# Patient Record
Sex: Male | Born: 1985 | Race: White | Hispanic: No | Marital: Married | Smoking: Never smoker
Health system: Southern US, Community
[De-identification: ages and names within clinical notes are randomized; demographics above are authoritative.]

## PROBLEM LIST (undated history)

## (undated) DIAGNOSIS — K589 Irritable bowel syndrome without diarrhea: Secondary | ICD-10-CM

## (undated) DIAGNOSIS — R7303 Prediabetes: Secondary | ICD-10-CM

## (undated) DIAGNOSIS — R600 Localized edema: Secondary | ICD-10-CM

## (undated) DIAGNOSIS — G8929 Other chronic pain: Secondary | ICD-10-CM

## (undated) DIAGNOSIS — M549 Dorsalgia, unspecified: Secondary | ICD-10-CM

## (undated) DIAGNOSIS — M2550 Pain in unspecified joint: Secondary | ICD-10-CM

## (undated) DIAGNOSIS — R1310 Dysphagia, unspecified: Secondary | ICD-10-CM

## (undated) DIAGNOSIS — R5383 Other fatigue: Secondary | ICD-10-CM

## (undated) DIAGNOSIS — E559 Vitamin D deficiency, unspecified: Secondary | ICD-10-CM

## (undated) DIAGNOSIS — K219 Gastro-esophageal reflux disease without esophagitis: Secondary | ICD-10-CM

## (undated) DIAGNOSIS — E7800 Pure hypercholesterolemia, unspecified: Secondary | ICD-10-CM

## (undated) DIAGNOSIS — R519 Headache, unspecified: Secondary | ICD-10-CM

## (undated) DIAGNOSIS — R0602 Shortness of breath: Secondary | ICD-10-CM

## (undated) HISTORY — DX: Shortness of breath: R06.02

## (undated) HISTORY — DX: Dysphagia, unspecified: R13.10

## (undated) HISTORY — DX: Localized edema: R60.0

## (undated) HISTORY — DX: Prediabetes: R73.03

## (undated) HISTORY — DX: Irritable bowel syndrome, unspecified: K58.9

## (undated) HISTORY — DX: Other chronic pain: G89.29

## (undated) HISTORY — DX: Dorsalgia, unspecified: M54.9

## (undated) HISTORY — DX: Other fatigue: R53.83

## (undated) HISTORY — PX: ADENOIDECTOMY: SUR15

## (undated) HISTORY — DX: Gastro-esophageal reflux disease without esophagitis: K21.9

## (undated) HISTORY — DX: Pain in unspecified joint: M25.50

## (undated) HISTORY — DX: Vitamin D deficiency, unspecified: E55.9

## (undated) HISTORY — DX: Headache, unspecified: R51.9

## (undated) HISTORY — DX: Pure hypercholesterolemia, unspecified: E78.00

---
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
## 2014-06-18 ENCOUNTER — Encounter (HOSPITAL_BASED_OUTPATIENT_CLINIC_OR_DEPARTMENT_OTHER): Payer: Self-pay | Admitting: Emergency Medicine

## 2014-06-18 ENCOUNTER — Emergency Department (HOSPITAL_BASED_OUTPATIENT_CLINIC_OR_DEPARTMENT_OTHER)
Admission: EM | Admit: 2014-06-18 | Discharge: 2014-06-18 | Disposition: A | Discharge status: Home / Self Care | Payer: Managed Care, Other (non HMO) | Attending: Emergency Medicine | Admitting: Emergency Medicine

## 2014-06-18 DIAGNOSIS — M545 Low back pain, unspecified: Secondary | ICD-10-CM | POA: Insufficient documentation

## 2014-06-18 DIAGNOSIS — E860 Dehydration: Secondary | ICD-10-CM | POA: Insufficient documentation

## 2014-06-18 LAB — BASIC METABOLIC PANEL
BUN: 13 mg/dL (ref 6–23)
CHLORIDE: 100 meq/L (ref 96–112)
CO2: 27 mEq/L (ref 19–32)
CREATININE: 0.9 mg/dL (ref 0.50–1.35)
Calcium: 9.2 mg/dL (ref 8.4–10.5)
GFR calc non Af Amer: >90 mL/min (ref >90)
Glucose, Bld: 88 mg/dL (ref 70–99)
Potassium: 3.8 mEq/L (ref 3.7–5.3)
Sodium: 140 mEq/L (ref 137–147)

## 2014-06-18 LAB — URINALYSIS, ROUTINE W REFLEX MICROSCOPIC
BILIRUBIN URINE: NEGATIVE
Glucose, UA: NEGATIVE mg/dL
Hgb urine dipstick: NEGATIVE
KETONES UR: NEGATIVE mg/dL
Leukocytes, UA: NEGATIVE
NITRITE: NEGATIVE
PH: 6 (ref 5.0–8.0)
Protein, ur: NEGATIVE mg/dL
Specific Gravity, Urine: 1.011 (ref 1.005–1.030)
UROBILINOGEN UA: 1 mg/dL (ref 0.0–1.0)

## 2014-06-18 MED ORDER — SODIUM CHLORIDE 0.9 % IV BOLUS (SEPSIS)
1000.0000 mL | Freq: Once | INTRAVENOUS | Status: AC
  Administered 2014-06-18: 1000 mL via INTRAVENOUS

## 2014-06-18 NOTE — ED Provider Notes (Signed)
CSN: [id number]     Arrival date & time 06/18/14  1708 History   First MD Initiated Contact with Patient 06/18/14 1754     Chief Complaint  Patient presents with  . 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 He denies vomiting or diarrhea. He has been drinking enough fluids and Gatorade. No fever. He feels dizzy when he stands but no syncope or near syncope. He complains of lower back pain and soreness.   The history is provided by the patient. No language interpreter was used.    History reviewed. No pertinent past medical history. History reviewed. No pertinent past surgical history. No family history on file. History  Substance Use Topics  . Smoking status: Never Smoker   . Smokeless tobacco: Not on file  . Alcohol Use: No    Review of Systems  Constitutional: Negative for fever.  Respiratory: Negative.   Cardiovascular: Negative.   Gastrointestinal: Negative.   Genitourinary: Negative for decreased urine volume.  Musculoskeletal: Positive for back pain.  Neurological: Positive for dizziness and weakness.      Allergies  Review of patient's allergies indicates no known allergies.  Home Medications   Prior to Admission medications   Medication Sig Start Date End Date Taking? Authorizing Provider  cetirizine (ZYRTEC) 10 MG tablet Take 10 mg by mouth as needed for allergies.   Yes Historical Provider, MD   BP 139/95  Pulse 82  Temp(Src) 97.5 F (36.4 C) (Oral)  Resp 16  Ht 6\' 3"  (1.905 m)  Wt 238 lb (107.956 kg)  BMI 29.75 kg/m2  SpO2 99% Physical Exam  Constitutional: He is oriented to person, place, and time. He appears well-developed and well-nourished.  HENT:  Head: Normocephalic.  Neck: Normal range of motion. Neck supple.   Cardiovascular: Normal rate and regular rhythm.   Pulmonary/Chest: Effort normal and breath sounds normal.  Abdominal: Soft. Bowel sounds are normal. There is no tenderness. There is no rebound and no guarding.  Musculoskeletal: Normal range of motion.  No palpable muscular back tenderness.   Neurological: He is alert and oriented to person, place, and time.  Skin: Skin is warm and dry. No rash noted.  Psychiatric: He has a normal mood and affect.    ED Course  Procedures (including critical care time) Labs Review Labs Reviewed  URINALYSIS, ROUTINE W REFLEX MICROSCOPIC  BASIC METABOLIC PANEL    Imaging Review No results found.   EKG Interpretation None      MDM   Final diagnoses:  None    1. Mild dehydration  Chemistries and urine show no signs of significant dehydration. IV fluids given with improvement in patient's condition. He feels improved. No nausea. No further dizziness. Stable for discharge.     Arnoldo HookerShari A Upstill, PA-C 06/18/14 1921

## 2014-06-18 NOTE — ED Notes (Signed)
Pt c/o feeling dehydrated and muscle cramps. [facility] [place] [facility] [place] and [place] labs and urine tests. Pt sts he was told to drink fluids. Pt sts his back is hurting worse so he decided to come to ED.

## 2014-06-18 NOTE — Discharge Instructions (Signed)
Dehydration, Adult Dehydration is when you lose more fluids from the body than you take in. Vital organs like the kidneys, brain, and heart cannot function without a proper amount of fluids and salt. Any loss of fluids from the body can cause dehydration.  CAUSES   Vomiting.  Diarrhea.  Excessive sweating.  Excessive urine output.  Fever. SYMPTOMS  Mild dehydration  Thirst.  Dry lips.  Slightly dry mouth. Moderate dehydration  Very dry mouth.  Sunken eyes.  Skin does not bounce back quickly when lightly pinched and released.  Dark urine and decreased urine production.  Decreased tear production.  Headache. Severe dehydration  Very dry mouth.  Extreme thirst.  Rapid, weak pulse (more than 100 beats per minute at rest).  Cold hands and feet.  Not able to sweat in spite of heat and temperature.  Rapid breathing.  Blue lips.  Confusion and lethargy.  Difficulty being awakened.  Minimal urine production.  No tears. DIAGNOSIS  Your caregiver will diagnose dehydration based on your symptoms and your exam. Blood and urine tests will help confirm the diagnosis. The diagnostic evaluation should also identify the cause of dehydration. TREATMENT  Treatment of mild or moderate dehydration can often be done at home by increasing the amount of fluids that you drink. It is best to drink small amounts of fluid more often. Drinking too much at one time can make vomiting worse. Refer to the home care instructions below. Severe dehydration needs to be treated at the hospital where you will probably be given intravenous (IV) fluids that contain water and electrolytes. HOME CARE INSTRUCTIONS   Ask your caregiver about specific rehydration instructions.  Drink enough fluids to keep your urine clear or pale yellow.  Drink small amounts frequently if you have nausea and vomiting.  Eat as you normally do.  Avoid:  Foods or drinks high in sugar.  Carbonated  drinks.  Juice.  Extremely hot or cold fluids.  Drinks with caffeine.  Fatty, greasy foods.  Alcohol.  Tobacco.  Overeating.  Gelatin desserts.  Wash your hands well to avoid spreading bacteria and viruses.  Only take over-the-counter or prescription medicines for pain, discomfort, or fever as directed by your caregiver.  Ask your caregiver if you should continue all prescribed and over-the-counter medicines.  Keep all follow-up appointments with your caregiver. SEEK MEDICAL CARE IF:  You have abdominal pain and it increases or stays in one area (localizes).  You have a rash, stiff neck, or severe headache.  You are irritable, sleepy, or difficult to awaken.  You are weak, dizzy, or extremely thirsty. SEEK IMMEDIATE MEDICAL CARE IF:   You are unable to keep fluids down or you get worse despite treatment.  You have frequent episodes of vomiting or diarrhea.  You have blood or green matter (bile) in your vomit.  You have blood in your stool or your stool looks black and tarry.  You have not urinated in 6 to 8 hours, or you have only urinated a small amount of very dark urine.  You have a fever.  You faint. MAKE SURE YOU:   Understand these instructions.  Will watch your condition.  Will get help right away if you are not doing well or get worse. Document Released: 12/13/2005 Document Revised: 03/06/2012 Document Reviewed: 08/02/2011 ExitCare Patient Information 2015 [employer]. This information is not intended to replace advice given to you by your health care provider. Make sure you discuss any questions you have with your health care   provider.  

## 2014-06-18 NOTE — ED Notes (Signed)
Reports working out in the heat the past few days and not drinking enough.  Seen by PMD earlier today, dx with dehydration and instructed to increase fluids.  Cramps are worse this evening.

## 2014-06-19 ENCOUNTER — Encounter (HOSPITAL_BASED_OUTPATIENT_CLINIC_OR_DEPARTMENT_OTHER): Payer: Self-pay | Admitting: Emergency Medicine

## 2014-06-19 ENCOUNTER — Emergency Department (HOSPITAL_BASED_OUTPATIENT_CLINIC_OR_DEPARTMENT_OTHER): Payer: Managed Care, Other (non HMO)

## 2014-06-19 ENCOUNTER — Emergency Department (HOSPITAL_BASED_OUTPATIENT_CLINIC_OR_DEPARTMENT_OTHER)
Admission: EM | Admit: 2014-06-19 | Discharge: 2014-06-19 | Disposition: A | Discharge status: Home / Self Care | Payer: Managed Care, Other (non HMO) | Attending: Emergency Medicine | Admitting: Emergency Medicine

## 2014-06-19 DIAGNOSIS — R630 Anorexia: Secondary | ICD-10-CM | POA: Insufficient documentation

## 2014-06-19 DIAGNOSIS — R110 Nausea: Secondary | ICD-10-CM | POA: Insufficient documentation

## 2014-06-19 DIAGNOSIS — M545 Low back pain, unspecified: Secondary | ICD-10-CM | POA: Insufficient documentation

## 2014-06-19 DIAGNOSIS — Z792 Long term (current) use of antibiotics: Secondary | ICD-10-CM | POA: Insufficient documentation

## 2014-06-19 DIAGNOSIS — Z791 Long term (current) use of non-steroidal anti-inflammatories (NSAID): Secondary | ICD-10-CM | POA: Insufficient documentation

## 2014-06-19 DIAGNOSIS — R509 Fever, unspecified: Secondary | ICD-10-CM

## 2014-06-19 LAB — COMPREHENSIVE METABOLIC PANEL
ALBUMIN: 4 g/dL (ref 3.5–5.2)
ALT: 32 U/L (ref 0–53)
AST: 43 U/L — AB (ref 0–37)
Alkaline Phosphatase: 65 U/L (ref 39–117)
BUN: 11 mg/dL (ref 6–23)
CALCIUM: 9 mg/dL (ref 8.4–10.5)
CHLORIDE: 97 meq/L (ref 96–112)
CO2: 25 mEq/L (ref 19–32)
CREATININE: 0.9 mg/dL (ref 0.50–1.35)
GFR calc Af Amer: >90 mL/min (ref >90)
GFR calc non Af Amer: >90 mL/min (ref >90)
Glucose, Bld: 112 mg/dL — ABNORMAL HIGH (ref 70–99)
Potassium: 4.2 mEq/L (ref 3.7–5.3)
Sodium: 133 mEq/L — ABNORMAL LOW (ref 137–147)
TOTAL PROTEIN: 7.4 g/dL (ref 6.0–8.3)
Total Bilirubin: 1 mg/dL (ref 0.3–1.2)

## 2014-06-19 LAB — CBC WITH DIFFERENTIAL/PLATELET
BASOS ABS: 0 10*3/uL (ref 0.0–0.1)
Basophils Relative: 0 % (ref 0–1)
EOS ABS: 0 10*3/uL (ref 0.0–0.7)
Eosinophils Relative: 0 % (ref 0–5)
HEMATOCRIT: 43.4 % (ref 39.0–52.0)
Hemoglobin: 15.2 g/dL (ref 13.0–17.0)
LYMPHS ABS: 0.3 10*3/uL — AB (ref 0.7–4.0)
LYMPHS PCT: 6 % — AB (ref 12–46)
MCH: 30 pg (ref 26.0–34.0)
MCHC: 35 g/dL (ref 30.0–36.0)
MCV: 85.8 fL (ref 78.0–100.0)
MONO ABS: 0.4 10*3/uL (ref 0.1–1.0)
Monocytes Relative: 9 % (ref 3–12)
NEUTROS ABS: 3.8 10*3/uL (ref 1.7–7.7)
Neutrophils Relative %: 85 % — ABNORMAL HIGH (ref 43–77)
Platelets: 137 10*3/uL — ABNORMAL LOW (ref 150–400)
RBC: 5.06 MIL/uL (ref 4.22–5.81)
RDW: 12.5 % (ref 11.5–15.5)
WBC: 4.5 10*3/uL (ref 4.0–10.5)

## 2014-06-19 LAB — URINALYSIS, ROUTINE W REFLEX MICROSCOPIC
Bilirubin Urine: NEGATIVE
GLUCOSE, UA: NEGATIVE mg/dL
HGB URINE DIPSTICK: NEGATIVE
Ketones, ur: NEGATIVE mg/dL
LEUKOCYTES UA: NEGATIVE
Nitrite: NEGATIVE
PH: 6.5 (ref 5.0–8.0)
Protein, ur: NEGATIVE mg/dL
SPECIFIC GRAVITY, URINE: 1.013 (ref 1.005–1.030)
Urobilinogen, UA: 1 mg/dL (ref 0.0–1.0)

## 2014-06-19 MED ORDER — NAPROXEN 500 MG PO TABS: 500.0000 mg | ORAL_TABLET | Freq: Two times a day (BID) | ORAL | Status: DC

## 2014-06-19 MED ORDER — ONDANSETRON HCL 4 MG/2ML IJ SOLN
4.0000 mg | Freq: Once | INTRAMUSCULAR | Status: AC
  Administered 2014-06-19: 4 mg via INTRAVENOUS
  Filled 2014-06-19: qty 2

## 2014-06-19 MED ORDER — HYDROCODONE-ACETAMINOPHEN 5-325 MG PO TABS: 1.0000 | ORAL_TABLET | ORAL | Status: DC | PRN

## 2014-06-19 MED ORDER — KETOROLAC TROMETHAMINE 30 MG/ML IJ SOLN
30.0000 mg | Freq: Once | INTRAMUSCULAR | Status: AC
  Administered 2014-06-19: 30 mg via INTRAVENOUS
  Filled 2014-06-19: qty 1

## 2014-06-19 MED ORDER — DOXYCYCLINE HYCLATE 100 MG IV SOLR: 100.0000 mg | Freq: Once | INTRAVENOUS | Status: DC

## 2014-06-19 MED ORDER — DOXYCYCLINE HYCLATE 100 MG PO TABS
100.0000 mg | ORAL_TABLET | Freq: Once | ORAL | Status: AC
  Administered 2014-06-19: 100 mg via ORAL

## 2014-06-19 MED ORDER — SODIUM CHLORIDE 0.9 % IV SOLN
1000.0000 mL | Freq: Once | INTRAVENOUS | Status: AC
  Administered 2014-06-19: 1000 mL via INTRAVENOUS

## 2014-06-19 MED ORDER — ACETAMINOPHEN 325 MG PO TABS
650.0000 mg | ORAL_TABLET | Freq: Once | ORAL | Status: AC
  Administered 2014-06-19: 650 mg via ORAL
  Filled 2014-06-19: qty 2

## 2014-06-19 MED ORDER — SODIUM CHLORIDE 0.9 % IV SOLN
1000.0000 mL | INTRAVENOUS | Status: DC
  Administered 2014-06-19: 1000 mL via INTRAVENOUS

## 2014-06-19 MED ORDER — DOXYCYCLINE HYCLATE 100 MG PO CAPS: 100.0000 mg | ORAL_CAPSULE | Freq: Two times a day (BID) | ORAL | Status: DC

## 2014-06-19 NOTE — ED Notes (Signed)
C/o chills, HA, nausea, back pain, dizziness and HA. "feel dehydrated". Alert, NAD, calm, interactive, speech clear[facility]

## 2014-06-19 NOTE — Discharge Instructions (Signed)
Fever, Adult A fever is a higher than normal body temperature. In an adult, an oral temperature around 98.6 F (37 C) is considered normal. A temperature of 100.4 F (38 C) or higher is generally considered a fever. Mild or moderate fevers generally have no long-term effects and often do not require treatment. Extreme fever (greater than or equal to 106 F or 41.1 C) can cause seizures. The sweating that may occur with repeated or prolonged fever may cause dehydration. Elderly people can develop confusion during a fever. A measured temperature can vary with:  Age.  Time of day.  Method of measurement (mouth, underarm, rectal, or ear). The fever is confirmed by taking a temperature with a thermometer. Temperatures can be taken different ways. Some methods are accurate and some are not.  An oral temperature is used most commonly. Electronic thermometers are fast and accurate.  An ear temperature will only be accurate if the thermometer is positioned as recommended by the manufacturer.  A rectal temperature is accurate and done for those adults who have a condition where an oral temperature cannot be taken.  An underarm (axillary) temperature is not accurate and not recommended. Fever is a symptom, not a disease.  CAUSES   Infections commonly cause fever.  Some noninfectious causes for fever include:  Some arthritis conditions.  Some thyroid or adrenal gland conditions.  Some immune system conditions.  Some types of cancer.  A medicine reaction.  High doses of certain street drugs such as methamphetamine.  Dehydration.  Exposure to high outside or room temperatures.  Occasionally, the source of a fever cannot be determined. This is sometimes called a "fever of unknown origin" (FUO).  Some situations may lead to a temporary rise in body temperature that may go away on its own. Examples are:  Childbirth.  Surgery.  Intense exercise. HOME CARE INSTRUCTIONS   Take  appropriate medicines for fever. Follow dosing instructions carefully. If you use acetaminophen to reduce the fever, be careful to avoid taking other medicines that also contain acetaminophen. Do not take aspirin for a fever if you are younger than age 65. There is an association with Reye's syndrome. Reye's syndrome is a rare but potentially deadly disease.  If an infection is present and antibiotics have been prescribed, take them as directed. Finish them even if you start to feel better.  Rest as needed.  Maintain an adequate fluid intake. To prevent dehydration during an illness with prolonged or recurrent fever, you may need to drink extra fluid.Drink enough fluids to keep your urine clear or pale yellow.  Sponging or bathing with room temperature water may help reduce body temperature. Do not use ice water or alcohol sponge baths.  Dress comfortably, but do not over-bundle. SEEK MEDICAL CARE IF:   You are unable to keep fluids down.  You develop vomiting or diarrhea.  You are not feeling at least partly better after 3 days.  You develop new symptoms or problems. SEEK IMMEDIATE MEDICAL CARE IF:   You have shortness of breath or trouble breathing.  You develop excessive weakness.  You are dizzy or you faint.  You are extremely thirsty or you are making little or no urine.  You develop new pain that was not there before (such as in the head, neck, chest, back, or abdomen).  You have persistant vomiting and diarrhea for more than 1 to 2 days.  You develop a stiff neck or your eyes become sensitive to light.  You develop a  skin rash.  You have a fever or persistent symptoms for more than 2 to 3 days.  You have a fever and your symptoms suddenly get worse. MAKE SURE YOU:   Understand these instructions.  Will watch your condition.  Will get help right away if you are not doing well or get worse. Document Released: 06/08/2001 Document Revised: 03/06/2012 Document  Reviewed: 10/14/2011 [facility] Patient Information 2015 [place], [place]. This information is not intended to replace advice given to you by your health care provider. Make sure you discuss any questions you have with your health care provider. [place] Spotted Fever [place] Spotted Fever (RMSF) is the oldest known tick-borne disease of people in the [place]. This disease was named because it was first described among people in the [place] area who had an illness characterized by a rash with red-purple-black spots. This disease is caused by a rickettsia (Rickettsia rickettsii), a bacteria carried by the tick. The [facility] wood tick and the American dog tick, acquire and transmit the RMSF bacteria (pictures NOT actual size). When a larval, nymphal or adult tick feeds on an infected rodent or larger animal, the tick can become infected. Infected adult ticks then feed on people who may then get RMSF. The tick transmits the disease to humans during a prolonged period of feeding that lasts many hours, days or even a couple weeks. The bite is painless and frequently goes unnoticed. An infected male tick may also pass the rickettsial bacteria to her eggs that then may mature to be infected adult ticks. The rickettsia that causes RMSF can also get into a person's body through damaged skin. A tick bite is not necessary. People can get RMSF if they crush a tick and get it's blood or body fluids on their skin through a small cut or sore.  DIAGNOSIS Diagnosis is made by laboratory tests.  TREATMENT Treatment is with antibiotics (medications that kill rickettsia and other bacteria). Immediate treatment usually prevents death. GEOGRAPHIC RANGE This disease was reported only in the [place] [facility] [place] until 1931. RMSF has more recently been described among individuals in all states except [place] and [place]. The highest reported incidences of RMSF now occur among residents of [place],  [place], [place] and [address] [place]. TIME OF YEAR  Most cases are diagnosed during late spring and summer when ticks are most active. However, especially in the warmer [place], a few cases occur during the winter. SYMPTOMS   Symptoms of RMSF begin from 2 to 14 days after a tick bite. The most common early symptoms are fever, muscle aches and headache followed by nausea (feeling sick to your stomach) or vomiting.  The RMSF rash is typically delayed until 3 or more days after symptom onset, and eventually develops in 9 of 10 infected patients by the 5th day of illness. If the disease is not treated it can cause death. If you get a fever, headache, muscle aches, rash, nausea or vomiting within 2 weeks of a possible tick bite or exposure you should see your caregiver immediately. PREVENTION Ticks prefer to hide in shady, moist ground litter. They can often be found above the ground clinging to tall grass, brush, shrubs and low tree branches. They also inhabit lawns and gardens, especially at the edges of woodlands and around old stone walls. Within the areas where ticks generally live, no naturally vegetated area can be considered completely free of infected ticks. The best precaution against RMSF is to avoid contact with soil,  leaf litter and vegetation as much as possible in tick infested areas. For those who enjoy gardening or walking in their yards, clear brush and mow tall grass around houses and at the edges of gardens. This may help reduce the tick population in the immediate area. Applications of chemical insecticides by a licensed professional in the spring (late May) and Fall (September) will also control ticks, especially in heavily infested areas. Treatment will never get rid of all the ticks. Getting rid of small animal populations that host ticks will also decrease the tick population. When working in the garden, Matt[place]uning shrubs, or handling soil and vegetation, wear light-colored  protective clothing and gloves. Spot-check often to prevent ticks from reaching the skin. Ticks cannot jump or fly. They will not drop from an above-ground perch onto a passing animal. Once a tick gains access to human skin it climbs upward until it reaches a more protected area. For example, the back of the knee, groin, navel, armpit, ears or nape of the neck. It then begins the slow process of embedding itself in the skin. Campers, hikers, field workers, and others who spend time in wooded, brushy or tall grassy areas can avoid exposure to ticks by using the following precautions:  Wear light-colored clothing with a tight weave to spot ticks more easily and prevent contact with the skin.  Wear long pants tucked into socks, long-sleeved shirts tucked into pants and enclosed shoes or boots along with insect repellent.  Spray clothes with insect repellent containing either DEET or Permethrin. Only DEET can be used on exposed skin. Follow the manufacturer's directions carefully.  Wear a hat and keep long hair pulled back.  Stay on cleared, well-worn trails whenever possible.  Spot-check yourself and others often for the presence of ticks on clothes. If you find one, there are likely to be others. Check thoroughly.  Remove clothes after leaving tick-infested areas. If possible, wash them to eliminate any unseen ticks. Check yourself, your children and any pets from head to toe for the presence of ticks.  Shower and shampoo. You can greatly reduce your chances of contracting RMSF if you remove attached ticks as soon as possible. Regular checks of the body, including all body sites covered by hair (head, armpits, genitals), allow removal of the tick before rickettsial transmission. To remove an attached tick, use a forceps or tweezers to detach the intact tick without leaving mouth parts in the skin. The tick bite wound should be cleansed after tick removal. Remember the most common symptoms of RMSF  are fever, muscle aches, headache and nausea or vomiting with a later onset of rash. If you get these symptoms after a tick bite and while living in an area where RMSF is found, RMSF should be suspected. If the disease is not treated, it can cause death. See your caregiver immediately if you get these symptoms. Do this even if not aware of a tick bite. Document Released: 03/27/2001 Document Revised: 03/06/2012 Document Reviewed: 11/17/2009 [facility] Patient Information 2015 [place], [place]. This information is not intended to replace advice given to you by your health care provider. Make sure you discuss any questions you have with your health care provider.

## 2014-06-19 NOTE — ED Notes (Signed)
Attempting urine sample, pt sweaty, temp decreased. Denies HA or dizziness at this time. Back pain reported as a 3/10.

## 2014-06-19 NOTE — ED Provider Notes (Signed)
  Medical screening examination/treatment/procedure(s) were performed by non-physician practitioner and as supervising physician I was immediately available for consultation/collaboration.   EKG Interpretation None         Gerhard Munchobert Lockwood, MD 06/19/14 [phone]

## 2014-06-19 NOTE — ED Notes (Signed)
"  Feels better", temp decreased, alert, NAD, calm, interactive[facility].

## 2014-06-19 NOTE — ED Provider Notes (Signed)
CSN: [id number]     Arrival date & time 06/19/14  1835 History   First MD Initiated Contact with Patient 06/19/14 1857     This chart was scribed for Linwood DibblesJon Knapp, MD by Arlan OrganAshley Leger, ED Scribe. This patient was seen in room MH06/MH06 and the patient's care was started 6:59 PM.   Chief Complaint  Patient presents with  . Dizziness   HPI  HPI Comments: Corey Pittman is a 28 y.o. male who presents to the Emergency Department complaining of constant, moderate HA  x 1 day that is unchanged. He also reports associated dizziness, loss of appetite, fever, nausea, and lower back pain onset this morning. Pt was seen 6/23 in ED and diagnosed with dehydration. Pt was given fluids at last visit with improvement. He has tried OTC Excedrin for symptoms without any noticeable improvement. No chills or noticeable rashes. He denies any known tick bites. No extended periods of time outdoors. He has no pertinent past medical history. No other concerns this visit.  History reviewed. No pertinent past medical history. History reviewed. No pertinent past surgical history. History reviewed. No pertinent family history. History  Substance Use Topics  . Smoking status: Never Smoker   . Smokeless tobacco: Not on file  . Alcohol Use: No    Review of Systems  Constitutional: Positive for fever and appetite change.  Gastrointestinal: Positive for nausea.  Musculoskeletal: Positive for back pain.  Neurological: Positive for dizziness.  All other systems reviewed and are negative.     Allergies  Review of patient's allergies indicates no known allergies.  Home Medications   Prior to Admission medications   Medication Sig Start Date End Date Taking? Authorizing Vernadine Coombs  cetirizine (ZYRTEC) 10 MG tablet Take 10 mg by mouth as needed for allergies.    Historical Damya Comley, MD  doxycycline (VIBRAMYCIN) 100 MG capsule Take 1 capsule (100 mg total) by mouth 2 (two) times daily. 06/19/14   Linwood DibblesJon Knapp, MD   HYDROcodone-acetaminophen (NORCO/VICODIN) 5-325 MG per tablet Take 1-2 tablets by mouth every 4 (four) hours as needed. 06/19/14   Linwood DibblesJon Knapp, MD  naproxen (NAPROSYN) 500 MG tablet Take 1 tablet (500 mg total) by mouth 2 (two) times daily. 06/19/14   Linwood DibblesJon Knapp, MD   Triage Vitals: BP 129/73  Pulse 108  Temp(Src) 100.6 F (38.1 C) (Oral)  Resp 16  Wt 238 lb (107.956 kg)  SpO2 100%   Physical Exam  Nursing note and vitals reviewed. Constitutional: He appears well-developed and well-nourished. No distress.  HENT:  Head: Normocephalic and atraumatic.  Right Ear: External ear normal.  Left Ear: External ear normal.  Eyes: Conjunctivae are normal. Right eye exhibits no discharge. Left eye exhibits no discharge. No scleral icterus.  Neck: Normal range of motion. Neck supple. No rigidity. No tracheal deviation, no erythema and normal range of motion present. No Brudzinski's sign and no Kernig's sign noted.  Cardiovascular: Normal rate, regular rhythm and intact distal pulses.   Pulmonary/Chest: Effort normal and breath sounds normal. No stridor. No respiratory distress. He has no wheezes. He has no rales.  Abdominal: Soft. Bowel sounds are normal. He exhibits no distension. There is no tenderness. There is no rebound and no guarding.  Musculoskeletal: He exhibits no edema and no tenderness.  Lymphadenopathy:    He has no cervical adenopathy.  Neurological: He is alert. He has normal strength. No cranial nerve deficit (no facial droop, extraocular movements intact, no slurred speech) or sensory deficit. He exhibits normal muscle tone.  He displays no seizure activity. Coordination normal.  Skin: Skin is warm and dry. No rash noted.  Psychiatric: He has a normal mood and affect.    ED Course  Procedures (including critical care time)  DIAGNOSTIC STUDIES: Oxygen Saturation is 100% on RA, Normal by my interpretation.    COORDINATION OF CARE: 10:59 PM-Discussed treatment plan with pt at  bedside and pt agreed to plan.     Labs Review Labs Reviewed  CBC WITH DIFFERENTIAL - Abnormal; Notable for the following:    Platelets 137 (*)    Neutrophils Relative % 85 (*)    Lymphocytes Relative 6 (*)    Lymphs Abs 0.3 (*)    All other components within normal limits  COMPREHENSIVE METABOLIC PANEL - Abnormal; Notable for the following:    Sodium 133 (*)    Glucose, Bld 112 (*)    AST 43 (*)    All other components within normal limits  URINALYSIS, ROUTINE W REFLEX MICROSCOPIC  ROCKY MTN SPOTTED FVR AB, IGG-BLOOD  ROCKY MTN SPOTTED FVR AB, IGM-BLOOD    Imaging Review Dg Chest 2 View  06/19/2014   CLINICAL DATA:  Fever.  Sweating.  Dizziness.  EXAM: CHEST  2 VIEW  COMPARISON:  None.  FINDINGS: Heart size and pulmonary vascularity are normal and the lungs are clear. No osseous abnormality.  IMPRESSION: Normal chest.   Electronically Signed   By: Geanie CooleyJim  Maxwell M.D.   On: 06/19/2014 20:55     MDM   Final diagnoses:  Fever, unspecified fever cause   Patient has no neck stiffness or meningismus. I doubt meningitis. There is no evidence of pneumonia. Patient does not know of any tick exposure however his symptoms could be related to early [facility] spotted fever. He does have a slight decrease in his platelet count and his sodium level.  I will start him on doxycycline.  Discussed treatment plan with the patient and his family. Recommend close outpatient followup with his primary Dr.  I personally performed the services described in this documentation, which was scribed in my presence. The recorded information has been reviewed and is accurate.    Linwood DibblesJon Knapp, MD 06/19/14 [phone]

## 2014-06-19 NOTE — ED Notes (Signed)
Pt c/o h/a and dizziness x 1 day , sen here yesterday for  same DX dehydration

## 2014-06-20 LAB — ROCKY MTN SPOTTED FVR AB, IGG-BLOOD: RMSF IGG: 0.11 IV

## 2014-06-20 LAB — ROCKY MTN SPOTTED FVR AB, IGM-BLOOD: RMSF IGM: 0.3 IV (ref 0.00–0.89)

## 2014-11-05 IMAGING — CR DG CHEST 2V
2 series · 2 of 2 positions shown · non-contrast
Comparison: None.

CLINICAL DATA: Fever.  Sweating.  Dizziness.

EXAM:
CHEST  2 VIEW

[w chest pa]
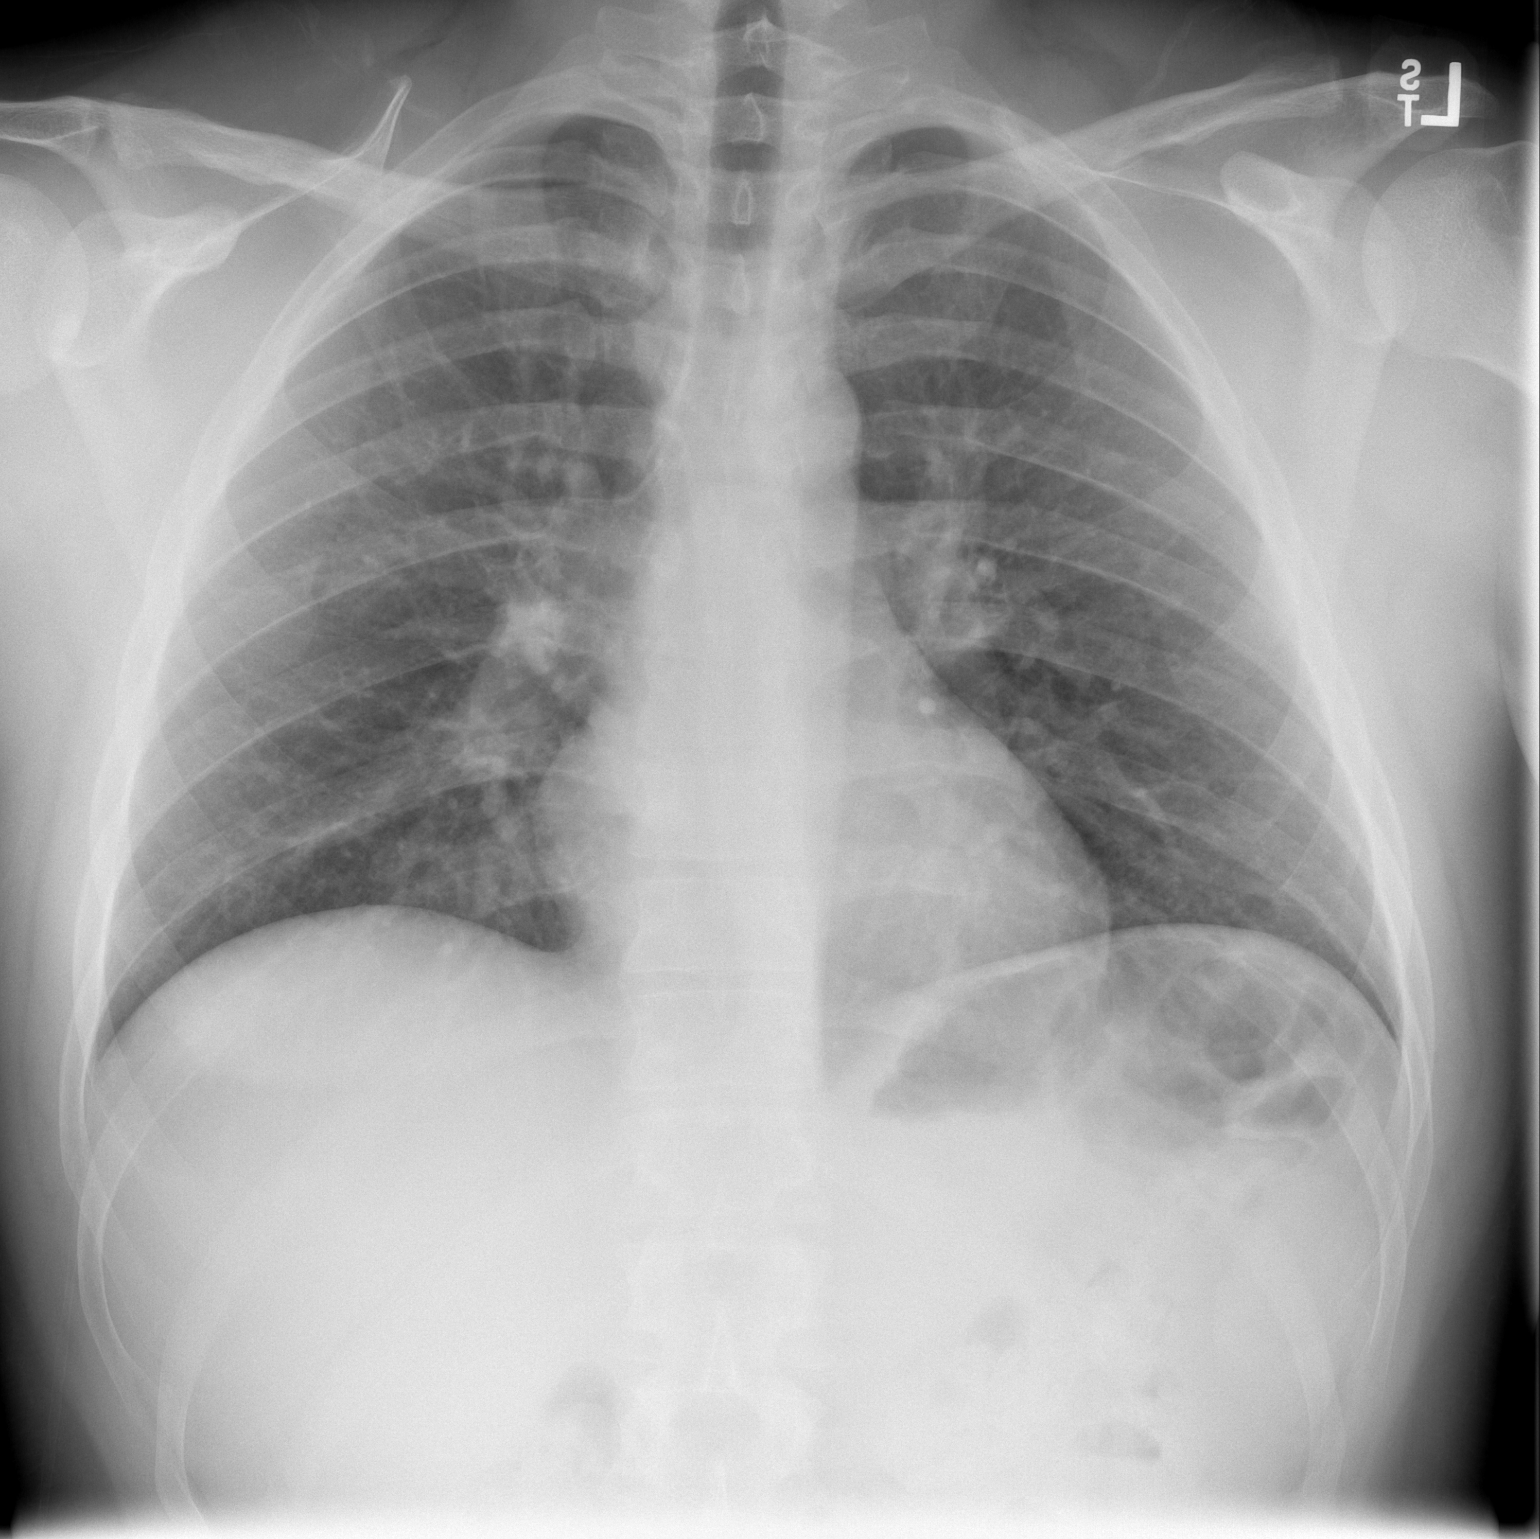

[w chest lat]
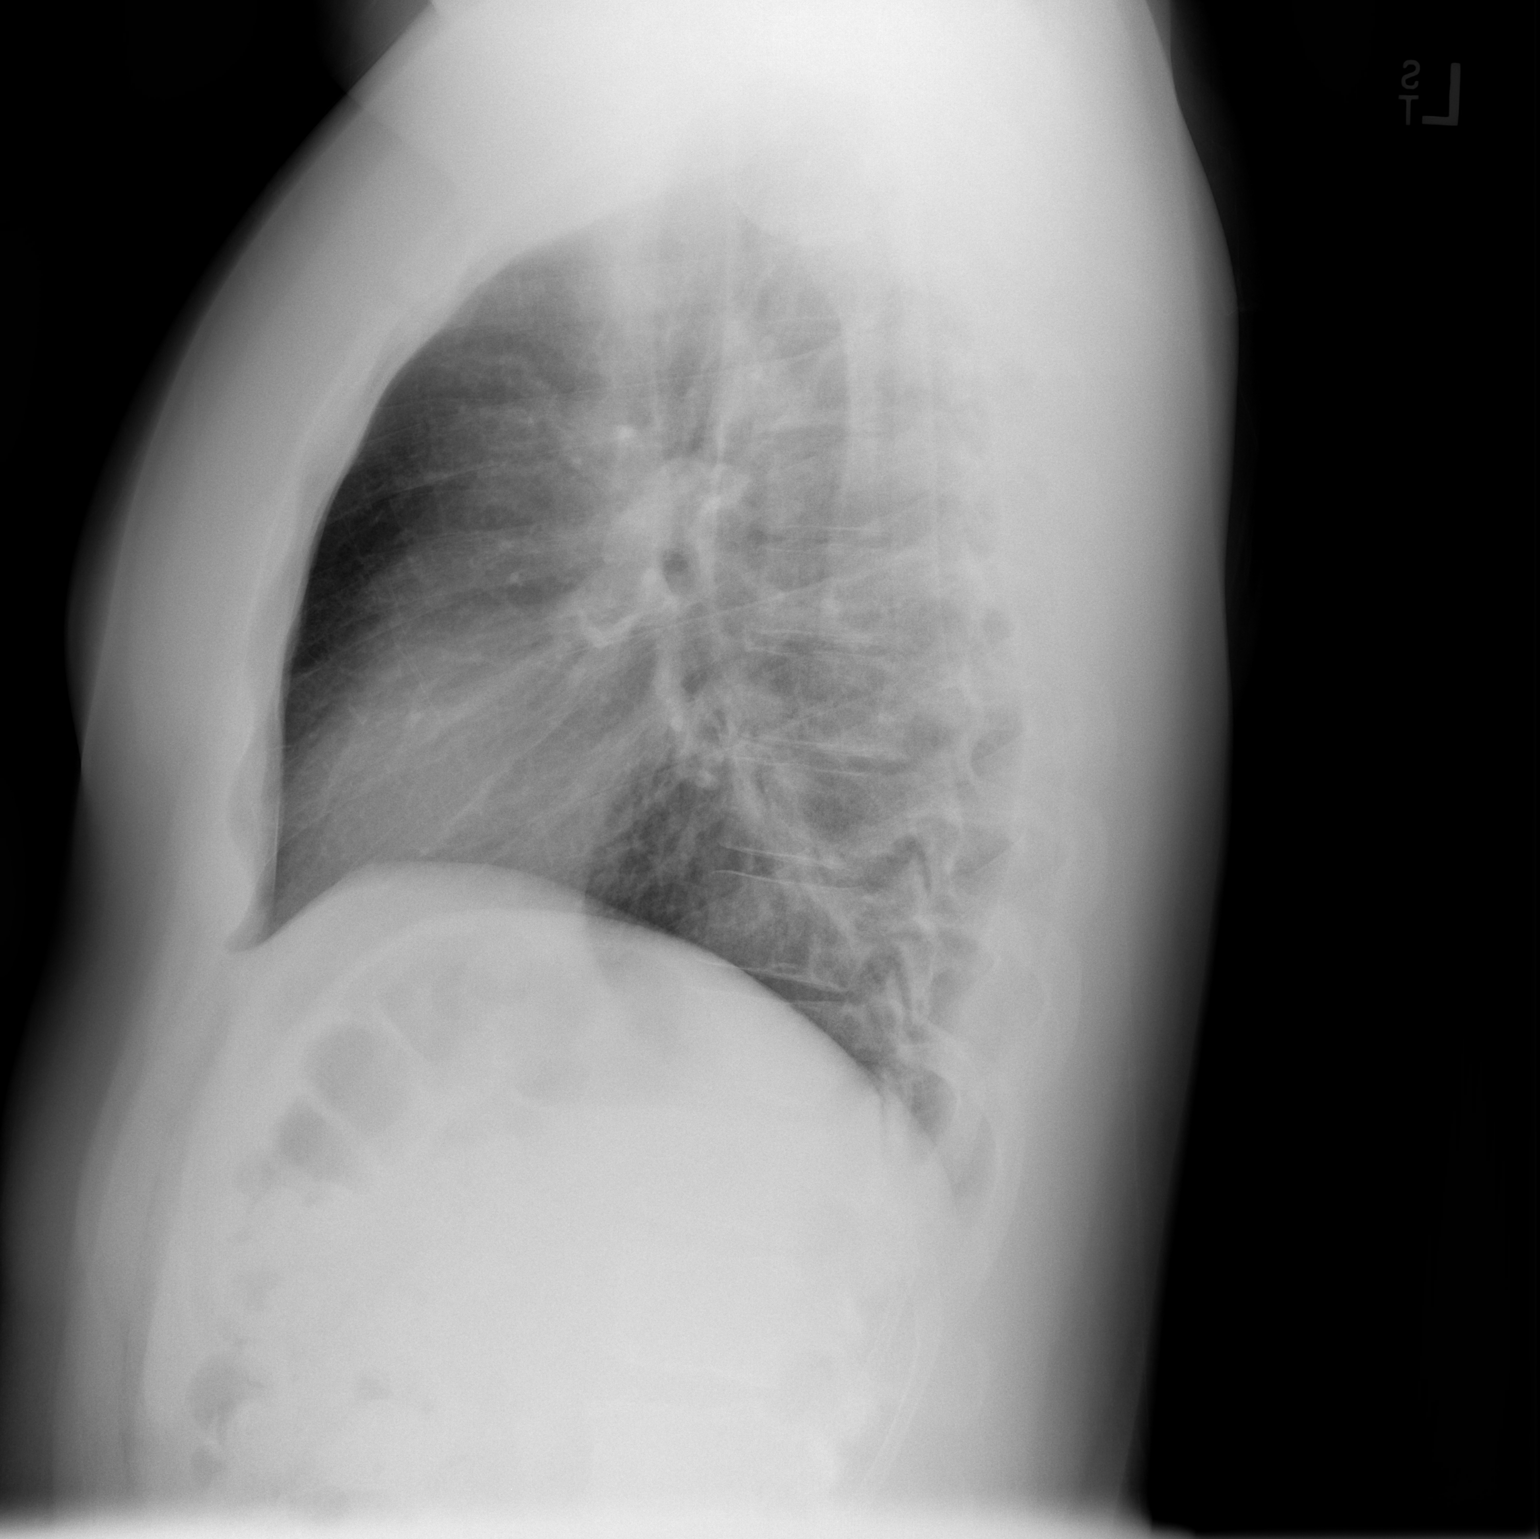

[2 of 2 positions shown; findings below may reference images not displayed]

FINDINGS: Heart size and pulmonary vascularity are normal and the lungs are
clear. No osseous abnormality.
IMPRESSION: Normal chest.

## 2016-08-31 ENCOUNTER — Emergency Department (HOSPITAL_BASED_OUTPATIENT_CLINIC_OR_DEPARTMENT_OTHER)
Admission: EM | Admit: 2016-08-31 | Discharge: 2016-08-31 | Disposition: A | Discharge status: Home / Self Care | Payer: Managed Care, Other (non HMO) | Attending: Emergency Medicine | Admitting: Emergency Medicine

## 2016-08-31 ENCOUNTER — Encounter (HOSPITAL_BASED_OUTPATIENT_CLINIC_OR_DEPARTMENT_OTHER): Payer: Self-pay | Admitting: Emergency Medicine

## 2016-08-31 DIAGNOSIS — I951 Orthostatic hypotension: Secondary | ICD-10-CM | POA: Diagnosis not present

## 2016-08-31 DIAGNOSIS — M791 Myalgia, unspecified site: Secondary | ICD-10-CM

## 2016-08-31 DIAGNOSIS — Z79899 Other long term (current) drug therapy: Secondary | ICD-10-CM | POA: Diagnosis not present

## 2016-08-31 DIAGNOSIS — R000 Tachycardia, unspecified: Secondary | ICD-10-CM | POA: Diagnosis not present

## 2016-08-31 DIAGNOSIS — R42 Dizziness and giddiness: Secondary | ICD-10-CM | POA: Diagnosis present

## 2016-08-31 LAB — BASIC METABOLIC PANEL
Anion gap: 10 (ref 5–15)
BUN: 12 mg/dL (ref 6–20)
CO2: 24 mmol/L (ref 22–32)
Calcium: 8.8 mg/dL — ABNORMAL LOW (ref 8.9–10.3)
Chloride: 100 mmol/L — ABNORMAL LOW (ref 101–111)
Creatinine, Ser: 0.77 mg/dL (ref 0.61–1.24)
GFR calc Af Amer: >60 mL/min (ref >60)
GLUCOSE: 95 mg/dL (ref 65–99)
POTASSIUM: 3.5 mmol/L (ref 3.5–5.1)
SODIUM: 134 mmol/L — AB (ref 135–145)

## 2016-08-31 LAB — CK: Total CK: 136 U/L (ref 49–397)

## 2016-08-31 LAB — TROPONIN I

## 2016-08-31 MED ORDER — SODIUM CHLORIDE 0.9 % IV BOLUS (SEPSIS)
2000.0000 mL | Freq: Once | INTRAVENOUS | Status: AC
  Administered 2016-08-31: 2000 mL via INTRAVENOUS

## 2016-08-31 MED ORDER — POTASSIUM CHLORIDE CRYS ER 20 MEQ PO TBCR: 60.0000 meq | EXTENDED_RELEASE_TABLET | Freq: Once | ORAL | Status: DC

## 2016-08-31 NOTE — ED Provider Notes (Signed)
[facility] Note   CSN: [id number] Arrival date & time: 08/31/16  1908  By signing my name below, I, Modena JanskyAlbert Thayil, attest that this documentation has been prepared under the direction and in the presence of Derwood KaplanAnkit Azhia Siefken, MD . Electronically Signed: Modena JanskyAlbert Thayil, Scribe. 08/31/2016. 10:05 PM.  History   Chief Complaint Chief Complaint  Patient presents with  . Tachycardia   The history is provided by the patient. No language interpreter was used.   HPI Comments: Corey Pittman is a 30 y.o. male who presents to the Emergency Department complaining of constant moderate dizziness that started 4 hours ago. [place]. Yesterday he started feeling faint, tachycardic, and had cramping lower back pain. He describes the dizziness as an unsteady sensation. He describes associated chest pain as sharp and non-radiating with no modifying factors. Other associated symptoms include SOB and bilateral knee cramping that started today without aggravating factors. Denies lightheadedness, hx of chest pain, cardiac problems, DVT, recent surgery, recent travel, or any recreational drug use.   History reviewed. No pertinent past medical history.  There are no active problems to display for this patient.   Past Surgical History:  Procedure Laterality Date  . ADENOIDECTOMY         Home Medications    Prior to Admission medications   Medication Sig Start Date End Date Taking? Authorizing Provider  cetirizine (ZYRTEC) 10 MG tablet Take 10 mg by mouth as needed for allergies.   Yes Historical Provider, MD  doxycycline (VIBRAMYCIN) 100 MG capsule Take 1 capsule (100 mg total) by mouth 2 (two) times daily. 06/19/14   Linwood DibblesJon Knapp, MD  HYDROcodone-acetaminophen (NORCO/VICODIN) 5-325 MG per tablet Take 1-2 tablets by mouth every 4 (four) hours as needed. 06/19/14   Linwood DibblesJon Knapp, MD  naproxen (NAPROSYN) 500 MG tablet Take 1 tablet (500 mg total) by mouth  2 (two) times daily. 06/19/14   Linwood DibblesJon Knapp, MD    Family History No family history on file.  Social History Social History  Substance Use Topics  . Smoking status: Never Smoker  . Smokeless tobacco: Never Used  . Alcohol use No     Allergies   Review of patient's allergies indicates no known allergies.   Review of Systems Review of Systems 10 Systems reviewed and all are negative for acute change except as noted in the HPI.  Physical Exam Updated Vital Signs BP 121/80 (BP Location: Right Arm)   Pulse 94   Temp 98.6 F (37 C) (Oral)   Resp 18   Ht 6\' 3"  (1.905 m)   Wt 260 lb (117.9 kg)   SpO2 98%   BMI 32.50 kg/m   Physical Exam  Constitutional: He appears well-developed and well-nourished. No distress.  HENT:  Head: Normocephalic and atraumatic.  Right Ear: External ear normal.  Left Ear: External ear normal.  Mouth/Throat: Mucous membranes are normal.  Eyes: Conjunctivae are normal. Right eye exhibits no discharge. Left eye exhibits no discharge. No scleral icterus.  Neck: Neck supple. No tracheal deviation present.  Cardiovascular: Normal rate and regular rhythm.   No murmur heard. Pulmonary/Chest: Effort normal. No stridor. No respiratory distress. He has no wheezes. He has no rales.  Musculoskeletal: He exhibits no edema.  No pitting edema.   Neurological: He is alert. Cranial nerve deficit: no gross deficits.  Skin: Skin is warm and dry. Capillary refill takes less than 2 seconds. No rash noted.  Psychiatric: He  has a normal mood and affect.  Nursing note and vitals reviewed.    ED Treatments / Results  DIAGNOSTIC STUDIES: Oxygen Saturation is 98% on RA, normal by my interpretation.    COORDINATION OF CARE: 10:09 PM- Pt advised of plan for treatment and pt agrees.  Labs (all labs ordered are listed, but only abnormal results are displayed) Labs Reviewed  BASIC METABOLIC PANEL - Abnormal; Notable for the following:       Result Value   Sodium 134  (*)    Chloride 100 (*)    Calcium 8.8 (*)    All other components within normal limits  TROPONIN I  CK    EKG  EKG Interpretation  Date/Time:  Tuesday August 31 2016 19:18:40 EDT Ventricular Rate:  107 PR Interval:  154 QRS Duration: 88 QT Interval:  324 QTC Calculation: 432 R Axis:   72 Text Interpretation:  Sinus tachycardia Otherwise normal ECG No acute changes Nonspecific T wave abnormality in lead III No old tracing to compare Confirmed by Rhunette Croft, MD, Janey Genta [id number]) on 08/31/2016 10:17:32 PM       Radiology No results found.  Procedures Procedures (including critical care time)  Medications Ordered in ED Medications  potassium chloride SA (K-DUR,KLOR-CON) CR tablet 60 mEq (not administered)  sodium chloride 0.9 % bolus 2,000 mL (0 mLs Intravenous Stopped 08/31/16 2342)     Initial Impression / Assessment and Plan / ED Course  I have reviewed the triage vital signs and the nursing notes.  Pertinent labs & imaging results that were available during my care of the patient were reviewed by me and considered in my medical decision making (see chart for details).  Clinical Course  Comment By Time  Results discussed. PE return precautions discussed and typed, patient is agreeing with the plan and is comfortable with the workup done and the recommendations from the ER.  Derwood Kaplan, MD 09/05 2347    I personally performed the services described in this documentation, which was scribed in my presence. The recorded information has been reviewed and is accurate.  Pt comes in with cc of dizziness, chest pain, myalgias, lower back pain. Healthy man. Pt has no hx of PE, DVT and denies any exogenous hormone use, long distance travels or surgery in the past 6 weeks, active cancer, recent immobilization. No smoking, drug use hx. No premature CAD in the family. Concerns for orthostatics - we will check them and hydrate. Also will ensure there was no rhabdo,    Final  Clinical Impressions(s) / ED Diagnoses   Final diagnoses:  Orthostatic hypotension  Myalgia    New Prescriptions New Prescriptions   No medications on file       Derwood Kaplan, MD 08/31/16 2348

## 2016-08-31 NOTE — ED Triage Notes (Addendum)
Pt reports elevated HR and muscle cramps in back and legs since about 3 pm. Pt also reports nausea x 2 days. Denies vomiting or diarrhea.

## 2016-08-31 NOTE — Discharge Instructions (Signed)
We saw you in the ER for the dizziness, chest pain, muscle aches. All the results in the ER are normal. Hydrate well, as there is evidence of dehydration.  Currently, we dont think there is PE. We recommend that if you notice any worsening of chest pain, worsening of shortness of breath, worsening of dizziness, with near fainting -return to the ER.

## 2016-12-09 DIAGNOSIS — E6609 Other obesity due to excess calories: Secondary | ICD-10-CM | POA: Insufficient documentation

## 2016-12-09 DIAGNOSIS — J302 Other seasonal allergic rhinitis: Secondary | ICD-10-CM | POA: Insufficient documentation

## 2017-12-22 DIAGNOSIS — R1319 Other dysphagia: Secondary | ICD-10-CM | POA: Insufficient documentation

## 2017-12-22 DIAGNOSIS — E782 Mixed hyperlipidemia: Secondary | ICD-10-CM | POA: Insufficient documentation

## 2017-12-22 DIAGNOSIS — K2100 Gastro-esophageal reflux disease with esophagitis, without bleeding: Secondary | ICD-10-CM | POA: Insufficient documentation

## 2021-01-16 DIAGNOSIS — L309 Dermatitis, unspecified: Secondary | ICD-10-CM | POA: Insufficient documentation

## 2021-11-15 NOTE — Progress Notes (Signed)
NEW PATIENT Date of Service/Encounter:  11/16/21 Referring provider: Molinda Bailiff, PA Primary care provider: Patria Mane, MD  Subjective:  Corey Pittman is a 35 y.o. male presenting today for evaluation of chronic rhinitis and chronic cough History obtained from: chart review and patient   Chronic rhinitis: started in childhood Symptoms include:  sinus pressure on face (eyes, cheeks), rhinorrhea, post nasal drainage, and sneezing , watery/itchy eyes Occurs seasonally-Spring and Summer Potential triggers: grass, dust Treatments tried: Claritin as needed Previous allergy testing: no History of reflux/heartburn:  yes-omeprazole 40 mg daily, doesn't always work  Chronic cough: Adequate positive and negative controls.  When the appointment was made, he was having chronic cough with whistling.  Not productive.  He was having a hard time breathing, and this lasted for about a month and a half.  It was worse in the morning.  He was sick prior to this.  He would get coughing fits during this and used his wife's albuterol which seemed to help. He does get winded sometimes, but isn't sure if this is due to conditioning. Never been diagnosed with asthma  Dyshidrotic eczema: occurs seasonally, severely itchy. Has steroid cream that knocks it out pretty fast.  He will get small blisters on sides of fingers.  Additionally he manages chronic issues with IBS.  Curious about allergy testing.  We discussed the limitations of using IgE testing for possible food sensitivities.  However, offered to do food allergy panel if interested to use as a guide for elimination diet.  Some patients find this helpful, but he is aware that the test was not designed for this.  He denies symptoms concerning for IgE mediated food allergy.  Other allergy screening: Food allergy:  apples make his mouth itch really bad, cooked apples tolerated without symptoms, peeling helps but does not completely eliminate symptoms.  Has  had mild lip swelling on 1 occasion following raw apple consumption.  He has similar symptoms with grapes. Medication allergy: no Hymenoptera allergy: no Urticaria: no  Past Medical History: History reviewed. No pertinent past medical history. Medication List:  Current Outpatient Medications  Medication Sig Dispense Refill   albuterol (VENTOLIN HFA) 108 (90 Base) MCG/ACT inhaler Inhale 2 puffs into the lungs every 6 (six) hours as needed for wheezing or shortness of breath. 8 g 2   azelastine (ASTELIN) 0.1 % nasal spray Place 2 sprays into both nostrils 2 (two) times daily as needed for rhinitis. Use in each nostril as directed 30 mL 6   cetirizine (ZYRTEC) 10 MG tablet Take 10 mg by mouth as needed for allergies.     fluticasone (FLONASE) 50 MCG/ACT nasal spray SPRAY 1 SPRAY INTO EACH NOSTRIL EVERY DAY     Multiple Vitamin (MULTI-VITAMIN) tablet Take 1 tablet by mouth daily.     Olopatadine HCl (PATADAY) 0.2 % SOLN Place 1 drop into both eyes daily as needed. 2.5 mL 5   omeprazole (PRILOSEC) 40 MG capsule Take by mouth.     Spacer/Aero-Holding Chambers DEVI 1 Device by Does not apply route as needed. 1 each 1   No current facility-administered medications for this visit.   Known Allergies:  No Known Allergies Past Surgical History: Past Surgical History:  Procedure Laterality Date   ADENOIDECTOMY     Family History: Family History  Problem Relation Age of Onset   Allergic rhinitis Mother    Allergic rhinitis Father    Asthma Sister    Allergic rhinitis Sister    Eczema Neg Hx  Immunodeficiency Neg Hx    Urticaria Neg Hx    Atopy Neg Hx    Social History: Corey Pittman lives in a house built 1 year ago, no water damage, carpet in the bedroom, gas heating, central AC, pet dog and doors, no visible pest, not using dust mite protection on bedding, no smoke exposure.  He works as a Medical sales representative for 10 years.  No HEPA filter in the home.  [place] an interstate/industrial area.    ROS:  All other systems negative except as noted per HPI.  Objective:  Blood pressure 116/76, pulse 66, temperature 97.7 F (36.5 C), temperature source Temporal, resp. rate 16, height 6' 3.4" (1.915 m), weight 277 lb 6.4 oz (125.8 kg), SpO2 99 %. Body mass index is 34.31 kg/m. Physical Exam:  General Appearance:  Alert, cooperative, no distress, appears stated age  Head:  Normocephalic, without obvious abnormality, atraumatic  Eyes:  Conjunctiva clear, EOM's intact  Nose: Nares normal, hypertrophic turbinates bilaterally, no visible anterior polyps,  Throat: Lips, tongue normal; teeth and gums normal, normal posterior oropharynx  Neck: Supple, symmetrical  Lungs:   Clear to auscultation bilaterally, respirations unlabored, no coughing  Heart:  Regular rate and rhythm, no murmur, appears well perfused  Extremities: No edema  Skin: Skin color, texture, turgor normal, no rashes or lesions on visualized portions of skin  Neurologic: No gross deficits   Diagnostics: Spirometry:  Tracings reviewed. His effort: Good reproducible efforts. FVC: 4.93L FEV1: 4.44L, 86% predicted FEV1/FVC ratio: 111% Interpretation: Spirometry consistent with normal pattern.  Please see scanned spirometry results for details.  Skin Testing: Environmental allergy panel. Adequate positive and negative controls. Results discussed with patient/family.  Airborne Adult Perc - 11/16/21 1115     Time Antigen Placed 1115    Allergen Manufacturer Waynette Buttery    Location Back    Number of Test 59    1. Control-Buffer 50% Glycerol Negative    2. Control-Histamine 1 mg/ml 3+    3. Albumin saline Negative    4. Bahia 3+    5. [place] 3+    6. Johnson 3+    7. Kentucky Blue 4+    8. Meadow Fescue 3+    9. Perennial Rye 3+    10. Sweet Vernal 3+    11. Timothy 4+    12. Cocklebur 3+    13. Burweed Marshelder Negative    14. Ragweed, short 4+    15. Ragweed, Giant 3+    16. Plantain,  English 3+    17.  Lamb's Quarters 2+    18. Sheep Sorrell 2+    19. Rough Pigweed 2+    20. Marsh Elder, Rough 3+    21. Mugwort, Common 3+    22. Ash mix 3+    23. Birch mix 4+    24. Beech American 3+    25. Box, Elder 3+    26. Cedar, red 3+    27. Cottonwood, Eastern 3+    28. Elm mix 2+    29. Hickory 2+    30. Maple mix Negative    31. Oak, [place] mix 4+    32. Pecan Pollen 3+    33. Pine mix Negative    34. Sycamore Eastern 3+    35. Walnut, Black Pollen 3+    36. Alternaria alternata 3+    37. Cladosporium Herbarum Negative    38. Aspergillus mix Negative    39. Penicillium mix Negative    40. Bipolaris  sorokiniana (Helminthosporium) Negative    41. Drechslera spicifera (Curvularia) Negative    42. Mucor plumbeus Negative    43. Fusarium moniliforme Negative    44. Aureobasidium pullulans (pullulara) Negative    45. Rhizopus oryzae Negative    46. Botrytis cinera Negative    47. Epicoccum nigrum Negative    48. Phoma betae Negative    49. Candida Albicans Negative    50. Trichophyton mentagrophytes Negative    51. Mite, D Farinae  5,000 AU/ml 4+    52. Mite, D Pteronyssinus  5,000 AU/ml 4+    53. Cat Hair 10,000 BAU/ml 4+    54.  Dog Epithelia Negative    55. Mixed Feathers Negative    56. Horse Epithelia Negative    57. Cockroach, German Negative    58. Mouse Negative    59. Tobacco Leaf Negative             Allergy testing results were read and interpreted by myself, documented by clinical staff.  Assessment and Plan   Patient Instructions  Chronic Rhinitis seasonal and perennial allergic rhinitis: - allergy testing today was positive to weeds, trees, grasses, outdoor mold, dust mite, cat - allergen avoidance as below - consider allergy shots as long term control of your symptoms by teaching your immune system to be more tolerant of your allergy triggers - Increase Nasal Steroid Spray: Options include Flonase (fluticasone), Nasocort (triamcinolone), Nasonex  (mometasome) 1- 2 sprays in each nostril daily (can buy over-the-counter if not covered by insurance)  Best results if used daily. - start Astelin (azelastine) use 1-2 sprays in each nostril up to two times daily as needed for NASAL CONGESTION/ITCHY NOSE. - Continue over the counter antihistamine daily or daily as needed.  Can increase to twice daily on "bad days"  -Your options include Zyrtec (Cetirizine) 10mg , Claritin (Loratadine) 10mg , Allegra (Fexofenadine) 180mg , or Xyzal (Levocetirinze) 5mg  - start Singulair (montelukast) 10 mg daily.   Allergic Conjunctivitis:  - Start Allergy Eye drops: great options include Pataday (Olopatadine) or Zaditor (ketotifen) for eye symptoms daily as needed-both sold over the counter if not covered by insurance.   -Avoid eye drops that say red eye relief as they may contain medications that dry out your eyes.  Chronic cough:  -Your breathing testing today looked great. - Has responded to albuterol in the past.  Currently asymptomatic. -Trial of albuterol (Ventolin/ProAir) to be used 2 puffs every 4-6 hours as needed for coughing, shortness of breath, or wheezing. -Make note if this is helpful.  If needing more than twice a week or having nighttime symptoms more than twice per month, please schedule follow-up appointment.  Oral Allergy Syndrome (apples, grapes): - Your symptoms are not consistent with true food allergies, and are more likely to be due to oral allergy syndrome. - These symptoms are not life-threatening and are because of a cross reaction between a pollen you are allergic to, and to a protein in specific foods (such as fresh fruits, vegetables, and nuts). - If you can eat these things it is fine to continue to do so.  If not, you may avoid these fresh fruits.  Heating these foods should allow them to be consumed without symptoms.  Chronic abdominal symptoms:  - make a follow-up at your convenience and we can do the food panel to help with food  elimination diet  Dyshidrotic eczema Symptoms currently controlled with as needed topical steroid.  He does not need refills. No current flares. Discussed use  of topical steroid twice a day as needed for flares.  Follow-up in 3 months, sooner if needed.   This note in its entirety was forwarded to the Provider who requested this consultation.  Thank you for your kind referral. I appreciate the opportunity to take part in Corey Pittman's care. Please do not hesitate to contact me with questions.  Sincerely,  Tonny Bollman, MD [facility]

## 2021-11-16 ENCOUNTER — Encounter: Payer: Self-pay | Admitting: Internal Medicine

## 2021-11-16 ENCOUNTER — Ambulatory Visit (INDEPENDENT_AMBULATORY_CARE_PROVIDER_SITE_OTHER): Payer: 59 | Admitting: Internal Medicine

## 2021-11-16 ENCOUNTER — Other Ambulatory Visit: Payer: Self-pay

## 2021-11-16 VITALS — BP 116/76 | HR 66 | Temp 97.7°F | Resp 16 | Ht 75.4 in | Wt 277.4 lb

## 2021-11-16 DIAGNOSIS — J302 Other seasonal allergic rhinitis: Secondary | ICD-10-CM | POA: Diagnosis not present

## 2021-11-16 DIAGNOSIS — T7819XA Other adverse food reactions, not elsewhere classified, initial encounter: Secondary | ICD-10-CM

## 2021-11-16 DIAGNOSIS — J3089 Other allergic rhinitis: Secondary | ICD-10-CM | POA: Diagnosis not present

## 2021-11-16 DIAGNOSIS — H1013 Acute atopic conjunctivitis, bilateral: Secondary | ICD-10-CM

## 2021-11-16 DIAGNOSIS — R053 Chronic cough: Secondary | ICD-10-CM | POA: Diagnosis not present

## 2021-11-16 DIAGNOSIS — T781XXA Other adverse food reactions, not elsewhere classified, initial encounter: Secondary | ICD-10-CM

## 2021-11-16 DIAGNOSIS — L301 Dyshidrosis [pompholyx]: Secondary | ICD-10-CM | POA: Diagnosis not present

## 2021-11-16 DIAGNOSIS — T7840XA Allergy, unspecified, initial encounter: Secondary | ICD-10-CM

## 2021-11-16 MED ORDER — OLOPATADINE HCL 0.2 % OP SOLN: 1.0000 [drp] | Freq: Every day | OPHTHALMIC | 5 refills | Status: DC | PRN

## 2021-11-16 MED ORDER — SPACER/AERO-HOLDING CHAMBERS DEVI: 1.0000 | 1 refills | Status: DC | PRN

## 2021-11-16 MED ORDER — ALBUTEROL SULFATE HFA 108 (90 BASE) MCG/ACT IN AERS: 2.0000 | INHALATION_SPRAY | Freq: Four times a day (QID) | RESPIRATORY_TRACT | 2 refills | Status: DC | PRN

## 2021-11-16 MED ORDER — AZELASTINE HCL 0.1 % NA SOLN: 2.0000 | Freq: Two times a day (BID) | NASAL | 6 refills | Status: DC | PRN

## 2021-11-16 NOTE — Patient Instructions (Addendum)
Chronic Rhinitis seasonal and perennial allergic rhinitis: - allergy testing today was positive to weeds, trees, grasses, outdoor mold, dust mite, cat - allergen avoidance as below - consider allergy shots as long term control of your symptoms by teaching your immune system to be more tolerant of your allergy triggers - Increase Nasal Steroid Spray: Options include Flonase (fluticasone), Nasocort (triamcinolone), Nasonex (mometasome) 1- 2 sprays in each nostril daily (can buy over-the-counter if not covered by insurance)  Best results if used daily. - start Astelin (azelastine) use 1-2 sprays in each nostril up to two times daily as needed for NASAL CONGESTION/ITCHY NOSE. - Continue over the counter antihistamine daily or daily as needed.  Can increase to twice daily on "bad days"  -Your options include Zyrtec (Cetirizine) 10mg , Claritin (Loratadine) 10mg , Allegra (Fexofenadine) 180mg , or Xyzal (Levocetirinze) 5mg  - start Singulair (montelukast) 10 mg daily.   Allergic Conjunctivitis:  - Start Allergy Eye drops: great options include Pataday (Olopatadine) or Zaditor (ketotifen) for eye symptoms daily as needed-both sold over the counter if not covered by insurance.   -Avoid eye drops that say red eye relief as they may contain medications that dry out your eyes.  Chronic cough:  -Your breathing testing today looked great -Trial of albuterol (Ventolin/ProAir) to be used 2 puffs every 4-6 hours as needed for coughing, shortness of breath, or wheezing. -Make note if this is helpful.  If needing more than twice a week or having nighttime symptoms more than twice per month, please schedule follow-up appointment.  Oral Allergy Syndrome (apples, grapes): - Your symptoms are not consistent with true food allergies, and are more likely to be due to oral allergy syndrome. - These symptoms are not life-threatening and are because of a cross reaction between a pollen you are allergic to, and to a protein in  specific foods (such as fresh fruits, vegetables, and nuts). - If you can eat these things it is fine to continue to do so.  If not, you may avoid these fresh fruits.  Heating these foods should allow them to be consumed without symptoms.  Chronic abdominal symptoms:  - make a follow-up at your convenience and we can do the food panel to help with food elimination diet  Follow-up in 3 months, sooner if needed.    ------------------------------------ Reducing Pollen Exposure  The American Academy of Allergy, Asthma and Immunology suggests the following steps to reduce your exposure to pollen during allergy seasons.    Do not hang sheets or clothing out to dry; pollen may collect on these items. Do not mow lawns or spend time around freshly cut grass; mowing stirs up pollen. Keep windows closed at night.  Keep car windows closed while driving. Minimize morning activities outdoors, a time when pollen counts are usually at their highest. Stay indoors as much as possible when pollen counts or humidity is high and on windy days when pollen tends to remain in the air longer. Use air conditioning when possible.  Many air conditioners have filters that trap the pollen spores. Use a HEPA room air filter to remove pollen form the indoor air you breathe.  Control of Dog or Cat Allergen  Avoidance is the best way to manage a dog or cat allergy. If you have a dog or cat and are allergic to dog or cats, consider removing the dog or cat from the home. If you have a dog or cat but don't want to find it a new home, or if your family  wants a pet even though someone in the household is allergic, here are some strategies that may help keep symptoms at bay:  Keep the pet out of your bedroom and restrict it to only a few rooms. Be advised that keeping the dog or cat in only one room will not limit the allergens to that room. Don't pet, hug or kiss the dog or cat; if you do, wash your hands with soap and  water. High-efficiency particulate air (HEPA) cleaners run continuously in a bedroom or living room can reduce allergen levels over time. Regular use of a high-efficiency vacuum cleaner or a central vacuum can reduce allergen levels. Giving your dog or cat a bath at least once a week can reduce airborne allergen.  DUST MITE AVOIDANCE MEASURES:  There are three main measures that need and can be taken to avoid house dust mites:  Reduce accumulation of dust in general -reduce furniture, clothing, carpeting, books, stuffed animals, especially in bedroom  Separate yourself from the dust -use pillow and mattress encasements (can be found at stores such as [employer] or online) -avoid direct exposure to air condition flow -use a HEPA filter device, especially in the bedroom; you can also use a HEPA filter vacuum cleaner -wipe dust with a moist towel instead of a dry towel or broom when cleaning  Decrease mites and/or their secretions -wash clothing and linen and stuffed animals at highest temperature possible, at least every 2 weeks -stuffed animals can also be placed in a bag and put in a freezer overnight  Despite the above measures, it is impossible to eliminate dust mites or their allergen completely from your home.  With the above measures the burden of mites in your home can be diminished, with the goal of minimizing your allergic symptoms.  Success will be reached only when implementing and using all means together.  Control of Mold Allergen   Mold and fungi can grow on a variety of surfaces provided certain temperature and moisture conditions exist.  Outdoor molds grow on plants, decaying vegetation and soil.  The major outdoor mold, Alternaria and Cladosporium, are found in very high numbers during hot and dry conditions.  Generally, a late Summer - Fall peak is seen for common outdoor fungal spores.  Rain will temporarily lower outdoor mold spore count, but counts rise  rapidly when the rainy period ends.  The most important indoor molds are Aspergillus and Penicillium.  Dark, humid and poorly ventilated basements are ideal sites for mold growth.  The next most common sites of mold growth are the bathroom and the kitchen.  Outdoor (Seasonal) Mold Control  Positive outdoor molds via skin testing: Alternaria  Use air conditioning and keep windows closed Avoid exposure to decaying vegetation. Avoid leaf raking. Avoid grain handling. Consider wearing a face mask if working in moldy areas.

## 2021-11-18 ENCOUNTER — Encounter: Payer: Self-pay | Admitting: Internal Medicine

## 2021-11-18 NOTE — Addendum Note (Signed)
Addended by: Florence Canner on: 11/18/2021 10:09 AM   Modules accepted: Orders

## 2021-11-23 ENCOUNTER — Other Ambulatory Visit: Payer: Self-pay | Admitting: Internal Medicine

## 2021-11-23 ENCOUNTER — Other Ambulatory Visit: Payer: Self-pay

## 2021-11-23 DIAGNOSIS — J302 Other seasonal allergic rhinitis: Secondary | ICD-10-CM

## 2021-11-23 MED ORDER — EPINEPHRINE 0.3 MG/0.3ML IJ SOAJ: 0.3000 mg | Freq: Once | INTRAMUSCULAR | 1 refills | Status: AC

## 2021-11-23 NOTE — Progress Notes (Signed)
Aeroallergen Immunotherapy   Ordering Provider: Dr. Tonny Bollman   Patient Details  Name: Corey Pittman  MRN: [id number]  Date of Birth: 10/24/1986   Order 2 of 2   Vial Label: M-DM-C   0.2 ml (Volume)  1:20 Concentration -- Alternaria alternata  0.5 ml (Volume)  1:10 Concentration -- Cat Hair  0.5 ml (Volume)   AU Concentration -- Mite Mix (DF 5,000 & DP 5,000)    1.2  ml Extract Subtotal  3.8  ml Diluent  5.0  ml Maintenance Total   Schedule:  B  Blue Vial (1:100,000): Schedule B (6 doses)  Yellow Vial (1:10,000): Schedule B (6 doses)  Green Vial (1:1,000): Schedule B (6 doses)  Red Vial (1:100): Schedule A (10 doses)   Special Instructions: normal B protocol

## 2021-11-23 NOTE — Progress Notes (Signed)
Encounter created to send Epipen Rx.

## 2021-11-23 NOTE — Progress Notes (Signed)
VIALS NOT MADE. APPT NOT SCHED °

## 2021-11-23 NOTE — Progress Notes (Signed)
Encounter created for allergy shot RX.

## 2021-11-23 NOTE — Progress Notes (Signed)
Aeroallergen Immunotherapy   Ordering Provider: Dr. Tonny Bollman   Patient Details  Name: Corey Pittman  MRN: [id number]  Date of Birth: 1986/11/07   Order 1 of 2   Vial Label: W-T-G   0.3 ml (Volume)  BAU Concentration -- 7 Grass Mix* 100,000 ([address] [place], [place], [place], [place], [place], Sweet [place], [place])  0.2 ml (Volume)  1:20 Concentration -- [place]  0.3 ml (Volume)  BAU Concentration -- [place] 10,000  0.2 ml (Volume)  1:20 Concentration -- [place]  0.3 ml (Volume)  1:20 Concentration -- Ragweed Mix  0.2 ml (Volume)  1:20 Concentration -- [place]  0.2 ml (Volume)  1:10 Concentration -- Plantain English  0.5 ml (Volume)  1:20 Concentration -- Weed Mix*  0.5 ml (Volume)  1:20 Concentration -- Eastern 10 Tree Mix (also Sweet Gum)  0.2 ml (Volume)  1:20 Concentration -- Box Elder  0.2 ml (Volume)  1:10 Concentration -- Pecan Pollen  0.2 ml (Volume)  1:20 Concentration -- Walnut, Black Pollen    3.3  ml Extract Subtotal  1.7  ml Diluent  5.0  ml Maintenance Total   Schedule:  B  Blue Vial (1:100,000): Schedule B (6 doses)  Yellow Vial (1:10,000): Schedule B (6 doses)  Green Vial (1:1,000): Schedule B (6 doses)  Red Vial (1:100): Schedule A (10 doses)   Special Instructions: normal B protocol

## 2021-11-26 DIAGNOSIS — J302 Other seasonal allergic rhinitis: Secondary | ICD-10-CM | POA: Diagnosis not present

## 2021-11-26 NOTE — Progress Notes (Signed)
VIALS MADE. EXP 11-26-22 

## 2021-11-27 ENCOUNTER — Telehealth: Payer: Self-pay

## 2021-11-27 ENCOUNTER — Other Ambulatory Visit: Payer: Self-pay | Admitting: Internal Medicine

## 2021-11-27 ENCOUNTER — Other Ambulatory Visit: Payer: Self-pay

## 2021-11-27 MED ORDER — MONTELUKAST SODIUM 10 MG PO TABS: 10.0000 mg | ORAL_TABLET | Freq: Every day | ORAL | 5 refills | Status: DC

## 2021-11-27 MED ORDER — IPRATROPIUM BROMIDE 0.03 % NA SOLN: 2.0000 | Freq: Three times a day (TID) | NASAL | 5 refills | Status: DC

## 2021-11-27 NOTE — Telephone Encounter (Signed)
Thank you Carrie

## 2021-11-27 NOTE — Telephone Encounter (Signed)
Pts wife called in stating pt changed the air filters in there home earlier this week. Since then  he has had a lot of drainage in his throat now the drainage is gone and he is just having a clear runny nose. It is not thick. Did have one string of green mucus this morning when rinsing out his nose.pt is doing nasal sprays and antihistamine. Pt is doing nasal sprays and antihistamine even adding on benadryl last night. What else can pt try

## 2021-11-27 NOTE — Telephone Encounter (Signed)
Pt informed of directions and sent in rx for atrovent to [facility] in [place]

## 2021-11-27 NOTE — Addendum Note (Signed)
Addended by: Berna Bue on: 11/27/2021 09:35 AM   Modules accepted: Orders

## 2021-12-09 ENCOUNTER — Encounter: Payer: Self-pay | Admitting: Family Medicine

## 2021-12-09 ENCOUNTER — Ambulatory Visit (INDEPENDENT_AMBULATORY_CARE_PROVIDER_SITE_OTHER): Payer: 59 | Admitting: Family Medicine

## 2021-12-09 ENCOUNTER — Other Ambulatory Visit: Payer: Self-pay

## 2021-12-09 VITALS — BP 126/82 | HR 84 | Temp 98.3°F | Resp 17 | Ht 75.0 in | Wt 280.8 lb

## 2021-12-09 DIAGNOSIS — J3089 Other allergic rhinitis: Secondary | ICD-10-CM | POA: Diagnosis not present

## 2021-12-09 DIAGNOSIS — T7819XA Other adverse food reactions, not elsewhere classified, initial encounter: Secondary | ICD-10-CM

## 2021-12-09 DIAGNOSIS — J302 Other seasonal allergic rhinitis: Secondary | ICD-10-CM

## 2021-12-09 DIAGNOSIS — T781XXA Other adverse food reactions, not elsewhere classified, initial encounter: Secondary | ICD-10-CM

## 2021-12-09 DIAGNOSIS — L500 Allergic urticaria: Secondary | ICD-10-CM | POA: Diagnosis not present

## 2021-12-09 DIAGNOSIS — K9049 Malabsorption due to intolerance, not elsewhere classified: Secondary | ICD-10-CM

## 2021-12-09 DIAGNOSIS — H1013 Acute atopic conjunctivitis, bilateral: Secondary | ICD-10-CM | POA: Diagnosis not present

## 2021-12-09 NOTE — Progress Notes (Signed)
[address] [place] [address] Dept: [phone]  FOLLOW UP NOTE  Patient ID: Corey Pittman, male    DOB: September 12, 1986  Age: 35 y.o. MRN: [id number] Date of Office Visit: 12/09/2021  Assessment  Chief Complaint: Follow-up (Pt present for foods allergy testing.)  HPI Corey Pittman is a 35 year old male who presents to the clinic for a follow-up visit.  He was last seen in this clinic on 11/08/2019 by Dr. Maurine Minister for evaluation of allergic rhinitis and allergic conjunctivitis.  At today's visit, he reports that he is interested in skin testing for food allergies.  He reports that he has experienced throat closing 1 time which may have been after food ingestion for which he took Benadryl with relief of symptoms.  He reports hives occurring about 2 years ago without shortness of breath which resolved when he began taking cetirizine on a regular basis. He reports diarrhea occurring after consuming some foods especially heavy cream and occasionally ice cream.  He does report that he can consume cheese, yogurt, and milk without symptoms.  He continues to experience oral allergy syndrome with ingestion of some fresh fruits, however, he is able to eat fruits that have been processed or cooked without these symptoms.  He feels well overall today and has not had any antihistamines for the last 3 days.  His last environmental allergy testing was on 11/16/2021 and was positive to grass pollen, weed pollen, tree pollen, mold, dust mite, and cat. He is beginning allergen immunotherapy in a few weeks. His current medications are listed in the chart.    Drug Allergies:  No Known Allergies  Physical Exam: BP 126/82    Pulse 84    Temp 98.3 F (36.8 C) (Temporal)    Resp 17    Ht 6\' 3"  (1.905 m)    Wt 280 lb 12.8 oz (127.4 kg)    SpO2 97%    BMI 35.10 kg/m    Physical Exam Vitals reviewed.  Constitutional:      Appearance: Normal appearance.  HENT:     Head: Normocephalic and atraumatic.     Right Ear:  Tympanic membrane normal.     Left Ear: Tympanic membrane normal.     Nose:     Comments: Bilateral nares slightly erythematous with clear nasal drainage noted.  Septal deviation noted.  Pharynx normal.  Ears normal.  Eyes normal.    Mouth/Throat:     Pharynx: Oropharynx is clear.  Eyes:     Conjunctiva/sclera: Conjunctivae normal.  Cardiovascular:     Rate and Rhythm: Normal rate and regular rhythm.     Heart sounds: Normal heart sounds. No murmur heard. Pulmonary:     Effort: Pulmonary effort is normal.     Breath sounds: Normal breath sounds.     Comments: Lungs clear to auscultation Musculoskeletal:        General: Normal range of motion.     Cervical back: Normal range of motion and neck supple.  Skin:    General: Skin is warm and dry.  Neurological:     Mental Status: He is alert and oriented to person, place, and time.  Psychiatric:        Mood and Affect: Mood normal.        Behavior: Behavior normal.        Thought Content: Thought content normal.        Judgment: Judgment normal.    Diagnostics:  Skin testing to selected foods was negative with  positive controls.   Assessment and Plan: 1. Seasonal and perennial allergic rhinitis   2. Allergic conjunctivitis of both eyes   3. Pollen-food allergy, initial encounter   4. Food intolerance     Patient Instructions  Allergic rhinitis Continue allergen avoidance measures directed toward pollens, molds, dust mite, and cat as listed below Begin allergen immunotherapy once a week and have access to an epinephrine autoinjector set Continue an over-the-counter antihistamine once a day as needed for runny nose or itch.  Remember to rotate to a different antihistamine about every 3 months. Some examples of over the counter antihistamines include Zyrtec (cetirizine), Xyzal (levocetirizine), Allegra (fexofenadine), and Claritin (loratidine).  Continue montelukast 10 mg once a day for control of allergic rhinitis Consider  saline nasal rinses as needed for nasal symptoms. Use this before any medicated nasal sprays for best result  Allergic conjunctivitis Some over the counter eye drops include Pataday one drop in each eye once a day as needed for red, itchy eyes OR Zaditor one drop in each eye twice a day as needed for red itchy eyes.  Food intolerance Today's testing was negative to the foods that were selected. If your symptoms re-occur, begin a journal of events that occurred for up to 6 hours before your symptoms began including foods and beverages consumed, soaps or perfumes you had contact with, and medications.    Oral allergy syndrome   - The oral allergy syndrome (OAS) or pollen-food allergy syndrome (PFAS) is a relatively common form of food allergy, particularly in adults. It typically occurs in people who have pollen allergies when the immune system "sees" proteins on the food that look like proteins on the pollen. This results in the allergy antibody (IgE) binding to the food instead of the pollen. Patients typically report itching and/or mild swelling of the mouth and throat immediately following ingestion of certain uncooked fruits (including nuts) or raw vegetables. Only a very small number of affected individuals experience systemic allergic reactions, such as anaphylaxis which occurs with true food allergies.  Continue to avoid foods that irritate your mouth Call the clinic if this treatment plan is not working well for you.  Follow up in 3 months or sooner if needed.    Return in about 3 months (around 03/09/2022), or if symptoms worsen or fail to improve.    Thank you for the opportunity to care for this patient.  Please do not hesitate to contact me with questions.  Thermon Leyland, FNP [facility]

## 2021-12-09 NOTE — Patient Instructions (Addendum)
Allergic rhinitis Continue allergen avoidance measures directed toward pollens, molds, dust mite, and cat as listed below Begin allergen immunotherapy once a week and have access to an epinephrine autoinjector set Continue an over-the-counter antihistamine once a day as needed for runny nose or itch.  Remember to rotate to a different antihistamine about every 3 months. Some examples of over the counter antihistamines include Zyrtec (cetirizine), Xyzal (levocetirizine), Allegra (fexofenadine), and Claritin (loratidine).  Continue montelukast 10 mg once a day for control of allergic rhinitis Consider saline nasal rinses as needed for nasal symptoms. Use this before any medicated nasal sprays for best result  Allergic conjunctivitis Some over the counter eye drops include Pataday one drop in each eye once a day as needed for red, itchy eyes OR Zaditor one drop in each eye twice a day as needed for red itchy eyes.  Food intolerance Today's testing was negative to the foods that were selected. If your symptoms re-occur, begin a journal of events that occurred for up to 6 hours before your symptoms began including foods and beverages consumed, soaps or perfumes you had contact with, and medications.   Oral allergy syndrome   - The oral allergy syndrome (OAS) or pollen-food allergy syndrome (PFAS) is a relatively common form of food allergy, particularly in adults. It typically occurs in people who have pollen allergies when the immune system "sees" proteins on the food that look like proteins on the pollen. This results in the allergy antibody (IgE) binding to the food instead of the pollen. Patients typically report itching and/or mild swelling of the mouth and throat immediately following ingestion of certain uncooked fruits (including nuts) or raw vegetables. Only a very small number of affected individuals experience systemic allergic reactions, such as anaphylaxis which occurs with true food allergies.   Continue to avoid foods that irritate your mouth  Call the clinic if this treatment plan is not working well for you.  Follow up in 3 months or sooner if needed.   Reducing Pollen Exposure The American Academy of Allergy, Asthma and Immunology suggests the following steps to reduce your exposure to pollen during allergy seasons. Do not hang sheets or clothing out to dry; pollen may collect on these items. Do not mow lawns or spend time around freshly cut grass; mowing stirs up pollen. Keep windows closed at night.  Keep car windows closed while driving. Minimize morning activities outdoors, a time when pollen counts are usually at their highest. Stay indoors as much as possible when pollen counts or humidity is high and on windy days when pollen tends to remain in the air longer. Use air conditioning when possible.  Many air conditioners have filters that trap the pollen spores. Use a HEPA room air filter to remove pollen form the indoor air you breathe.  Control of Mold Allergen Mold and fungi can grow on a variety of surfaces provided certain temperature and moisture conditions exist.  Outdoor molds grow on plants, decaying vegetation and soil.  The major outdoor mold, Alternaria and Cladosporium, are found in very high numbers during hot and dry conditions.  Generally, a late Summer - Fall peak is seen for common outdoor fungal spores.  Rain will temporarily lower outdoor mold spore count, but counts rise rapidly when the rainy period ends.  The most important indoor molds are Aspergillus and Penicillium.  Dark, humid and poorly ventilated basements are ideal sites for mold growth.  The next most common sites of mold growth are the bathroom  and the kitchen.  Outdoor [employer] Use air conditioning and keep windows closed Avoid exposure to decaying vegetation. Avoid leaf raking. Avoid grain handling. Consider wearing a face mask if working in moldy areas.  Indoor Mold  Control Maintain humidity below 50%. Clean washable surfaces with 5% bleach solution. Remove sources e.g. Contaminated carpets.   Control of Dust Mite Allergen Dust mites play a major role in allergic asthma and rhinitis. They occur in environments with high humidity wherever human skin is found. Dust mites absorb humidity from the atmosphere (ie, they do not drink) and feed on organic matter (including shed human and animal skin). Dust mites are a microscopic type of insect that you cannot see with the naked eye. High levels of dust mites have been detected from mattresses, pillows, carpets, upholstered furniture, bed covers, clothes, soft toys and any woven material. The principal allergen of the dust mite is found in its feces. A gram of dust may contain 1,000 mites and 250,000 fecal particles. Mite antigen is easily measured in the air during house cleaning activities. Dust mites do not bite and do not cause harm to humans, other than by triggering allergies/asthma.  Ways to decrease your exposure to dust mites in your home:  1. Encase mattresses, box springs and pillows with a mite-impermeable barrier or cover  2. Wash sheets, blankets and drapes weekly in hot water (130 F) with detergent and dry them in a dryer on the hot setting.  3. Have the room cleaned frequently with a vacuum cleaner and a damp dust-mop. For carpeting or rugs, vacuuming with a vacuum cleaner equipped with a high-efficiency particulate air (HEPA) filter. The dust mite allergic individual should not be in a room which is being cleaned and should wait 1 hour after cleaning before going into the room.  4. Do not sleep on upholstered furniture (eg, couches).  5. If possible removing carpeting, upholstered furniture and drapery from the home is ideal. Horizontal blinds should be eliminated in the rooms where the person spends the most time (bedroom, study, television room). Washable vinyl, roller-type shades are  optimal.  6. Remove all non-washable stuffed toys from the bedroom. Wash stuffed toys weekly like sheets and blankets above.  7. Reduce indoor humidity to less than 50%. Inexpensive humidity monitors can be purchased at most hardware stores. Do not use a humidifier as can make the problem worse and are not recommended.  Control of Dog or Cat Allergen Avoidance is the best way to manage a dog or cat allergy. If you have a dog or cat and are allergic to dog or cats, consider removing the dog or cat from the home. If you have a dog or cat but dont want to find it a new home, or if your family wants a pet even though someone in the household is allergic, here are some strategies that may help keep symptoms at bay:  Keep the pet out of your bedroom and restrict it to only a few rooms. Be advised that keeping the dog or cat in only one room will not limit the allergens to that room. Dont pet, hug or kiss the dog or cat; if you do, wash your hands with soap and water. High-efficiency particulate air (HEPA) cleaners run continuously in a bedroom or living room can reduce allergen levels over time. Regular use of a high-efficiency vacuum cleaner or a central vacuum can reduce allergen levels. Giving your dog or cat a bath at least once a week  can reduce airborne allergen.  The oral allergy syndrome (OAS) or pollen-food allergy syndrome (PFAS) is a relatively common form of food allergy, particularly in adults. It typically occurs in people who have pollen allergies when the immune system "sees" proteins on the food that look like proteins on the pollen. This results in the allergy antibody (IgE) binding to the food instead of the pollen. Patients typically report itching and/or mild swelling of the mouth and throat immediately following ingestion of certain uncooked fruits (including nuts) or raw vegetables. Only a very small number of affected individuals experience systemic allergic reactions, such as  anaphylaxis which occurs with true food allergies.

## 2021-12-22 ENCOUNTER — Other Ambulatory Visit: Payer: Self-pay

## 2021-12-22 ENCOUNTER — Ambulatory Visit (INDEPENDENT_AMBULATORY_CARE_PROVIDER_SITE_OTHER): Payer: 59

## 2021-12-22 ENCOUNTER — Telehealth: Payer: Self-pay

## 2021-12-22 ENCOUNTER — Encounter: Payer: Self-pay | Admitting: Family Medicine

## 2021-12-22 DIAGNOSIS — J309 Allergic rhinitis, unspecified: Secondary | ICD-10-CM

## 2021-12-22 NOTE — Telephone Encounter (Addendum)
Patient started his first allergy injections today Blue vials 1:100,000 W-T-G and M-DM-C. Schedule B weekly. I went over all the signs/symptoms and how to use the epi pen. I put the patient in the exam room across from the shot room, after about 5 minutes patient stated his tongue felt tingly, but he thought it may have been in his head since I just went over the signs/symptoms with him. I wait 10-15 minutes and he said he felt the same with his tongue tingly/ thick. I went and got Thurston Hole and I did his 1st set of vitals taken at 3:55 pm bp 120/88, pr 73, rr 16  spo2 99 Anne listened to him lungs sounded clear and no gallops. No signs of tongue thickness or swelling. 2cd set of vitals taken at 4:12 pm bp.116/76 pr 78 rr 16 spo2 98. At 4:15 pm 20 ml of cetirizine given. Patient states his tongue felt better. Last set of vitals taken at 4:50 pm bp 106/80 pr 72, rr 16 spo2 99. Patient's instructions were to start his cetirizine 10 mg tomorrow and Thurston Hole wants [place] to check on him tomorrow. The only symptom he had was rhinorrhea and tongue tingling. Please advise

## 2021-12-22 NOTE — Progress Notes (Signed)
Immunotherapy   Patient Details  Name: Corey Pittman MRN: [id number] Date of Birth: 01/02/1986  12/22/2021  Corey Pittman started injections for  blue vials 1:100,000 W-T-G  0.05cc given in (L) arm and M-DM-C 0.05cc given in (R) arm Following schedule: A Frequency:1 time per week Epi-Pen:Epi-Pen Available  Consent signed and patient instructions given. Expiration of vials 11/26/2022  Allergic reaction:   Patient felt like his tongue felt funny and tingly within 5 minutes of his injection he stated not sure if he was over thinking,bc I had just went over symptoms with him. I checked on him in about 15 minutes later his mouth felt dry 2 dixie size cups of water given. After waiting 15 minutes he said his tongue felt a little thick 1st set of vitals were taken b/p 120/88, pr 73, rr 16.  Anne didn't see any tongue or throat swelling. Lungs sounded clear and no gallops. Will recheck bp at 4:10 pm 2cd set of vitals bp 116/76, pr.78 Rr 16 at 4:15 20 ml of cetirizine given. Corey Pittman instructions were to keep the Pt. Another 30 minutes. 4:50 pm last set of vitals taken bp 106/80 pr 72, rr 16 and spo2 99. Patient discharged. Murray Hodgkins 12/22/2021, 3:45 PM

## 2021-12-23 DIAGNOSIS — J3089 Other allergic rhinitis: Secondary | ICD-10-CM | POA: Diagnosis not present

## 2021-12-23 NOTE — Telephone Encounter (Signed)
Hi Michelle!  Thanks for the update.  Can we start on the silver vial? I'm okay with staying on B schedule if he can make it through silver.  If it happens again, we can go to A schedule.    Sooo.Marland KitchenB schedule silver

## 2021-12-23 NOTE — Telephone Encounter (Signed)
Thank you :)

## 2022-01-01 ENCOUNTER — Ambulatory Visit (INDEPENDENT_AMBULATORY_CARE_PROVIDER_SITE_OTHER): Payer: 59

## 2022-01-01 DIAGNOSIS — J309 Allergic rhinitis, unspecified: Secondary | ICD-10-CM | POA: Diagnosis not present

## 2022-01-08 ENCOUNTER — Ambulatory Visit (INDEPENDENT_AMBULATORY_CARE_PROVIDER_SITE_OTHER): Payer: 59

## 2022-01-08 DIAGNOSIS — J309 Allergic rhinitis, unspecified: Secondary | ICD-10-CM

## 2022-01-11 ENCOUNTER — Ambulatory Visit: Payer: 59 | Admitting: Internal Medicine

## 2022-01-11 ENCOUNTER — Ambulatory Visit (INDEPENDENT_AMBULATORY_CARE_PROVIDER_SITE_OTHER): Payer: 59 | Admitting: *Deleted

## 2022-01-11 DIAGNOSIS — J309 Allergic rhinitis, unspecified: Secondary | ICD-10-CM

## 2022-01-22 ENCOUNTER — Ambulatory Visit (INDEPENDENT_AMBULATORY_CARE_PROVIDER_SITE_OTHER): Payer: 59 | Admitting: *Deleted

## 2022-01-22 DIAGNOSIS — J309 Allergic rhinitis, unspecified: Secondary | ICD-10-CM

## 2022-01-26 ENCOUNTER — Ambulatory Visit (INDEPENDENT_AMBULATORY_CARE_PROVIDER_SITE_OTHER): Payer: 59

## 2022-01-26 DIAGNOSIS — J309 Allergic rhinitis, unspecified: Secondary | ICD-10-CM | POA: Diagnosis not present

## 2022-02-01 DIAGNOSIS — E559 Vitamin D deficiency, unspecified: Secondary | ICD-10-CM | POA: Insufficient documentation

## 2022-02-03 ENCOUNTER — Ambulatory Visit (INDEPENDENT_AMBULATORY_CARE_PROVIDER_SITE_OTHER): Payer: 59

## 2022-02-03 DIAGNOSIS — J309 Allergic rhinitis, unspecified: Secondary | ICD-10-CM | POA: Diagnosis not present

## 2022-02-09 ENCOUNTER — Ambulatory Visit (INDEPENDENT_AMBULATORY_CARE_PROVIDER_SITE_OTHER): Payer: 59

## 2022-02-09 DIAGNOSIS — J309 Allergic rhinitis, unspecified: Secondary | ICD-10-CM

## 2022-02-16 NOTE — Progress Notes (Signed)
FOLLOW UP Date of Service/Encounter:  02/18/22   Subjective:  Corey Pittman (DOB: Feb 08, 1986) is a 36 y.o. male who returns to the [facility] on 02/18/2022 in re-evaluation of the following: seasonal and perennial allergic rhinitis on SCIT, pollen food allergy syndrome (apples, great), dyshidrotic eczema History obtained from: chart review and patient.  For Review, LV was on 12/09/21  with Gareth Morgan, FNP.    Pertinent History/Diagnostics:  - Allergic Rhinitis: SCIT started 12/22/21, had to put on A schedule in silver vial as developed tongue tingling with normal vitals and exam, treated with cetirizine  - SPT environmental panel (11/16/21): grass pollen, weed pollen, tree pollen, mold, dust mite, and cat. - Food Intolerance (unknown triggers)  - SPT select foods (12/09/21): negative  Today he presents for follow-up. He has been doing well since starting allergy injections.  He did have a possible reaction on his very first injection, but since putting him on silver vial and doing slow buildup he has been tolerating them fine without any adverse events.  He cannot tell a significant difference in his symptoms since starting, but started less than 3 months ago.  He is taking Singulair every night.   He uses Zyrtec nightly as needed. Zyrtec makes him drowsy.  Claritin and Allegra aren't strong enough for him.  Xyzal is too strong.  He feels Delma Freeze is the better option between Claritin and Allegra. He has not had any issues with shortness of breath since he had bronchitis and walking pneumonia in October 2022.  He does still have the albuterol rescue inhaler, but has not had to use it since then. He does not report any flares of dyshidrotic eczema today.  Allergies as of 02/18/2022   No Known Allergies      Medication List        Accurate as of February 18, 2022  1:20 PM. If you have any questions, ask your nurse or doctor.          albuterol 108 (90 Base) MCG/ACT  inhaler Commonly known as: VENTOLIN HFA Inhale 2 puffs into the lungs every 6 (six) hours as needed for wheezing or shortness of breath.   azelastine 0.1 % nasal spray Commonly known as: ASTELIN Place 2 sprays into both nostrils 2 (two) times daily as needed for rhinitis. Use in each nostril as directed   cetirizine 10 MG tablet Commonly known as: ZYRTEC Take 10 mg by mouth as needed for allergies.   EPINEPHrine 0.3 mg/0.3 mL Soaj injection Commonly known as: EPI-PEN Inject into the muscle.   fluticasone 50 MCG/ACT nasal spray Commonly known as: FLONASE SPRAY 1 SPRAY INTO EACH NOSTRIL EVERY DAY   ipratropium 0.03 % nasal spray Commonly known as: ATROVENT Place 2 sprays into both nostrils 3 (three) times daily.   loratadine 10 MG tablet Commonly known as: CLARITIN Take 1 tablet by mouth daily.   montelukast 10 MG tablet Commonly known as: SINGULAIR Take 1 tablet (10 mg total) by mouth at bedtime.   Multi-Vitamin tablet Take 1 tablet by mouth daily.   Olopatadine HCl 0.2 % Soln Commonly known as: Pataday Place 1 drop into both eyes daily as needed.   omeprazole 40 MG capsule Commonly known as: PRILOSEC Take by mouth. What changed: Another medication with the same name was removed. Continue taking this medication, and follow the directions you see here. Changed by: Sigurd Sos, MD   OptiChamber Diamond-Lg Mask Devi USE AS DIRECTED AS NEEDED   VITAMIN D  PO Take 1,000 mg by mouth daily.       Past Medical History:  Diagnosis Date   Vitamin D deficiency    Past Surgical History:  Procedure Laterality Date   ADENOIDECTOMY     Otherwise, there have been no changes to his past medical history, surgical history, family history, or social history.  ROS: All others negative except as noted per HPI.   Objective:  BP 124/76    Pulse 75    Temp 98 F (36.7 C) (Temporal)    Resp 14    SpO2 99%  There is no height or weight on file to calculate BMI. Physical  Exam: General Appearance:  Alert, cooperative, no distress, appears stated age  Head:  Normocephalic, without obvious abnormality, atraumatic  Eyes:  Conjunctiva clear, EOM's intact  Nose: Nares normal, hypertrophic turbinates, normal mucosa, and no visible anterior polyps  Throat: Lips, tongue normal; teeth and gums normal, normal posterior oropharynx  Neck: Supple, symmetrical  Lungs:   clear to auscultation bilaterally, Respirations unlabored, no coughing  Heart:  regular rate and rhythm and no murmur, Appears well perfused  Extremities: No edema  Skin: Skin color, texture, turgor normal, no rashes or lesions on visualized portions of skin  Neurologic: No gross deficits  Spirometry:  Tracings reviewed. His effort: Good reproducible efforts. FVC: 5.74L FEV1: 4.39L, 86% predicted FEV1/FVC ratio: 94% Interpretation: Spirometry consistent with normal pattern.  Please see scanned spirometry results for details.  Assessment/Plan   Patient Instructions  Allergic rhinitis-partially controlled Continue allergen avoidance measures directed toward pollens, molds, dust mite, and cat as listed below Continue  Continue an over-the-counter antihistamine once a day as needed for runny nose or itch.  Try Allegra 180 mg daily or twice daily as needed. Continue montelukast 10 mg once a day for control of allergic rhinitis Consider saline nasal rinses as needed for nasal symptoms. Use this before any medicated nasal sprays for best result  Shortness of breath:-Controlled - use albuterol 2 puffs every 4 to 6 hours as needed for shortness of breath, wheezing - if needing more than twice a week, please schedule a follow-up  Allergic conjunctivitis-partially controlled Some over the counter eye drops include Pataday one drop in each eye once a day as needed for red, itchy eyes OR Zaditor one drop in each eye twice a day as needed for red itchy eyes.  Food intolerance-stable Avoid any foods that  cause you symptoms.  Oral allergy syndrome (apples, grapes Continue to avoid foods that irritate your mouth This may improve over time with your allergy injections.  Dyshidrotic eczema-controlled If recurs, start topical steroid ointment.  Call the clinic if this treatment plan is not working well for you.  Follow up in 6/ months or sooner if needed.      Sigurd Sos, MD  [facility]

## 2022-02-18 ENCOUNTER — Other Ambulatory Visit: Payer: Self-pay

## 2022-02-18 ENCOUNTER — Encounter: Payer: Self-pay | Admitting: Internal Medicine

## 2022-02-18 ENCOUNTER — Ambulatory Visit (INDEPENDENT_AMBULATORY_CARE_PROVIDER_SITE_OTHER): Payer: 59 | Admitting: Internal Medicine

## 2022-02-18 VITALS — BP 124/76 | HR 75 | Temp 98.0°F | Resp 14

## 2022-02-18 DIAGNOSIS — T781XXA Other adverse food reactions, not elsewhere classified, initial encounter: Secondary | ICD-10-CM

## 2022-02-18 DIAGNOSIS — J302 Other seasonal allergic rhinitis: Secondary | ICD-10-CM

## 2022-02-18 DIAGNOSIS — J309 Allergic rhinitis, unspecified: Secondary | ICD-10-CM | POA: Diagnosis not present

## 2022-02-18 DIAGNOSIS — J3089 Other allergic rhinitis: Secondary | ICD-10-CM

## 2022-02-18 DIAGNOSIS — R053 Chronic cough: Secondary | ICD-10-CM

## 2022-02-18 DIAGNOSIS — K9049 Malabsorption due to intolerance, not elsewhere classified: Secondary | ICD-10-CM

## 2022-02-18 DIAGNOSIS — H1013 Acute atopic conjunctivitis, bilateral: Secondary | ICD-10-CM

## 2022-02-18 NOTE — Patient Instructions (Addendum)
Allergic rhinitis Continue allergen avoidance measures directed toward pollens, molds, dust mite, and cat as listed below Continue  Continue an over-the-counter antihistamine once a day as needed for runny nose or itch.  Try Allegra 180 mg daily or twice daily as needed. Continue montelukast 10 mg once a day for control of allergic rhinitis Consider saline nasal rinses as needed for nasal symptoms. Use this before any medicated nasal sprays for best result  Shortness of breath:  - use albuterol 2 puffs every 4 to 6 hours as needed for shortness of breath, wheezing - if needing more than twice a week, please schedule a follow-up  Allergic conjunctivitis Some over the counter eye drops include Pataday one drop in each eye once a day as needed for red, itchy eyes OR Zaditor one drop in each eye twice a day as needed for red itchy eyes.  Food intolerance Avoid any foods that cause you symptoms.  Oral allergy syndrome (apples, grapes Continue to avoid foods that irritate your mouth This may improve over time with your allergy injections.  Dyshidrotic eczema-controlled  Call the clinic if this treatment plan is not working well for you.  Follow up in 6/ months or sooner if needed.  It was a pleasure seeing you in clinic today.

## 2022-03-01 ENCOUNTER — Ambulatory Visit (INDEPENDENT_AMBULATORY_CARE_PROVIDER_SITE_OTHER): Payer: 59

## 2022-03-01 DIAGNOSIS — J309 Allergic rhinitis, unspecified: Secondary | ICD-10-CM | POA: Diagnosis not present

## 2022-03-05 ENCOUNTER — Other Ambulatory Visit: Payer: Self-pay

## 2022-03-05 ENCOUNTER — Ambulatory Visit (INDEPENDENT_AMBULATORY_CARE_PROVIDER_SITE_OTHER): Payer: 59 | Admitting: Internal Medicine

## 2022-03-05 ENCOUNTER — Encounter: Payer: Self-pay | Admitting: Internal Medicine

## 2022-03-05 VITALS — BP 118/78 | HR 74 | Temp 97.7°F | Resp 16

## 2022-03-05 DIAGNOSIS — J309 Allergic rhinitis, unspecified: Secondary | ICD-10-CM

## 2022-03-05 DIAGNOSIS — L301 Dyshidrosis [pompholyx]: Secondary | ICD-10-CM

## 2022-03-05 DIAGNOSIS — J019 Acute sinusitis, unspecified: Secondary | ICD-10-CM | POA: Diagnosis not present

## 2022-03-05 DIAGNOSIS — H1013 Acute atopic conjunctivitis, bilateral: Secondary | ICD-10-CM

## 2022-03-05 MED ORDER — AMOXICILLIN-POT CLAVULANATE 875-125 MG PO TABS: 1.0000 | ORAL_TABLET | Freq: Two times a day (BID) | ORAL | 0 refills | Status: AC

## 2022-03-05 NOTE — Patient Instructions (Addendum)
Acute Sinusitis: suspected bacterial ?- start Augmentin 875, 1 tablet twice daily for 10 days ?- start baby pack of prednisone, can take full dose in morning to help with sleep ?- if you feel better on Monday, can return for allergy shots, otherwise wait until the next week ? ?Allergic rhinitis- ?Continue allergen avoidance measures directed toward pollens, molds, dust mite, and cat as listed below ?Continue  ?Continue an over-the-counter antihistamine once a day as needed for runny nose or itch.  Try Allegra 180 mg daily or twice daily as needed. ?Continue montelukast 10 mg once a day for control of allergic rhinitis ?Consider saline nasal rinses as needed for nasal symptoms. Use this before any medicated nasal sprays for best result ? ?Shortness of breath:  ?- use albuterol 2 puffs every 4 to 6 hours as needed for shortness of breath, wheezing ?- if needing more than twice a week, please schedule a follow-up ? ?Allergic conjunctivitis ?Some over the counter eye drops include Pataday one drop in each eye once a day as needed for red, itchy eyes OR Zaditor one drop in each eye twice a day as needed for red itchy eyes. ? ?Food intolerance ?Avoid any foods that cause you symptoms. ? ?Oral allergy syndrome (apples, grapes ?Continue to avoid foods that irritate your mouth ?This may improve over time with your allergy injections. ? ?Dyshidrotic eczema-controlled ? ?Call the clinic if this treatment plan is not working well for you. ? ?Follow up in 6 months or sooner if needed.  ?It was a pleasure seeing you again in clinic today. ? ? ?

## 2022-03-05 NOTE — Progress Notes (Signed)
? ?FOLLOW UP ?Date of Service/Encounter:  03/05/22 ? ? ?Subjective:  ?Corey Pittman (DOB: Dec 10, 1986) is a 36 y.o. male who [facility] in re-evaluation of the following: acute visit for concern for sinus infection ?History obtained from: chart review and patient. ? ?For Review, LV was on 02/18/22  with Dr.Kimla Furth.  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 FEV1 86% at last visit.  Tolerating allergy injections.  ? ?Pertinent History/Diagnostics:  ?- Allergic Rhinitis: SCIT started 12/22/21, had to put on A schedule in silver vial as developed tongue tingling with normal vitals and exam, treated with cetirizine ?               - SPT environmental panel (11/16/21): grass pollen, weed pollen, tree pollen, mold, dust mite, and cat. ?- Food Intolerance (unknown triggers) ?               - SPT select foods (12/09/21): negative ? ?Today presents for follow-up with acute symptoms of congestion, drainage, headaches.Marland Kitchen ?He started developing symptoms 2 weeks ago. Developed rhinorrhea and drainage.  He attributed this to increased dust (because he was moving homes).   ?Has a lot of drainage going down the back of his throat. Pressure in head and ears. ?Taste is weak, but not gone.  Smell is fine.   ?He has facial pressure on his cheeks, right ear and sore throat. ?He has no medication allergies. ?Otherwise he is doing well and has no other complaints.  ? ?Allergies as of 03/05/2022   ?No Known Allergies ?  ? ?  ?Medication List  ?  ? ?  ? Accurate as of March 05, 2022  2:26 PM. If you have any questions, ask your nurse or doctor.  ?  ?  ? ?  ? ?STOP taking these medications   ? ?loratadine 10 MG tablet ?Commonly known as: CLARITIN ?Stopped by: Tonny Bollman, MD ?  ?Olopatadine HCl 0.2 % Soln ?Commonly known as: Pataday ?Stopped by: Tonny Bollman, MD ?  ? ?  ? ?TAKE these  medications   ? ?albuterol 108 (90 Base) MCG/ACT inhaler ?Commonly known as: VENTOLIN HFA ?Inhale 2 puffs into the lungs every 6 (six) hours as needed for wheezing or shortness of breath. ?  ?amoxicillin-clavulanate 875-125 MG tablet ?Commonly known as: Augmentin ?Take 1 tablet by mouth 2 (two) times daily for 10 days. ?Started by: Tonny Bollman, MD ?  ?azelastine 0.1 % nasal spray ?Commonly known as: ASTELIN ?Place 2 sprays into both nostrils 2 (two) times daily as needed for rhinitis. Use in each nostril as directed ?  ?cetirizine 10 MG tablet ?Commonly known as: ZYRTEC ?Take 10 mg by mouth as needed for allergies. ?  ?EPINEPHrine 0.3 mg/0.3 mL Soaj injection ?Commonly known as: EPI-PEN ?Inject into the muscle. ?  ?fluticasone 50 MCG/ACT nasal spray ?Commonly known as: FLONASE ?SPRAY 1 SPRAY INTO EACH NOSTRIL EVERY DAY ?  ?ipratropium 0.03 % nasal spray ?Commonly known as: ATROVENT ?Place 2 sprays into both nostrils 3 (three) times daily. ?  ?montelukast 10 MG tablet ?Commonly known as: SINGULAIR ?Take 1 tablet (10 mg total) by mouth at bedtime. ?  ?Multi-Vitamin tablet ?Take 1 tablet by mouth daily. ?  ?omeprazole 40 MG capsule ?Commonly known as: PRILOSEC ?Take by mouth. ?  ?OptiChamber Diamond-Lg Mask Devi ?USE AS DIRECTED AS NEEDED ?  ?VITAMIN D PO ?  Take 1,000 mg by mouth daily. ?  ? ?  ? ?Past Medical History:  ?Diagnosis Date  ? Vitamin D deficiency   ? ?Past Surgical History:  ?Procedure Laterality Date  ? ADENOIDECTOMY    ? ?Otherwise, there have been no changes to his past medical history, surgical history, family history, or social history. ? ?ROS: All others negative except as noted per HPI.  ? ?Objective:  ?BP 118/78 (BP Location: Right Arm, Patient Position: Sitting, Cuff Size: Large)   Pulse 74   Temp 97.7 ?F (36.5 ?C) (Temporal)   Resp 16   SpO2 97%  ?There is no height or weight on file to calculate BMI. ?Physical Exam: ?General Appearance:  Alert, cooperative, no distress, appears stated age   ?Head:  Normocephalic, without obvious abnormality, atraumatic, + mild tenderness of maxillary sinuses  ?Eyes:  Conjunctiva clear, EOM's intact  ?Nose: Nares normal, hypertrophic turbinates, normal mucosa, and no visible anterior polyps, + yellowish discharge bilaterally   ?Throat: Lips, tongue normal; teeth and gums normal, normal posterior oropharynx and + cobblestoning  ?Neck: Supple, symmetrical  ?Lungs:   clear to auscultation bilaterally, Respirations unlabored, no coughing  ?Heart:  regular rate and rhythm and no murmur, Appears well perfused  ?Extremities: No edema  ?Skin: Skin color, texture, turgor normal, no rashes or lesions on visualized portions of skin  ?Neurologic: No gross deficits  ? ?Assessment/Plan  ? ?Acute Sinusitis: suspected bacterial ?- start Augmentin 875, 1 tablet twice daily for 10 days ?- start baby pack of prednisone, can take full dose in morning to help with sleep ?- if you feel better on Monday, can return for allergy shots, otherwise wait until the next week ? ?Allergic rhinitis- ?Continue allergen avoidance measures directed toward pollens, molds, dust mite, and cat as listed below ?Continue  ?Continue an over-the-counter antihistamine once a day as needed for runny nose or itch.  Try Allegra 180 mg daily or twice daily as needed. ?Continue montelukast 10 mg once a day for control of allergic rhinitis ?Consider saline nasal rinses as needed for nasal symptoms. Use this before any medicated nasal sprays for best result ? ?Shortness of breath:  ?- use albuterol 2 puffs every 4 to 6 hours as needed for shortness of breath, wheezing ?- if needing more than twice a week, please schedule a follow-up ? ?Allergic conjunctivitis ?Some over the counter eye drops include Pataday one drop in each eye once a day as needed for red, itchy eyes OR Zaditor one drop in each eye twice a day as needed for red itchy eyes. ? ?Food intolerance ?Avoid any foods that cause you symptoms. ? ?Oral allergy  syndrome (apples, grapes ?Continue to avoid foods that irritate your mouth ?This may improve over time with your allergy injections. ? ?Dyshidrotic eczema-controlled ? ?Call the clinic if this treatment plan is not working well for you. ? ?Follow up in 6 months or sooner if needed.  ?It was a pleasure seeing you again in clinic today. ? ? ? ?Tonny Bollman, MD  ?[facility] ? ? ? ? ? ? ?

## 2022-03-09 ENCOUNTER — Ambulatory Visit (INDEPENDENT_AMBULATORY_CARE_PROVIDER_SITE_OTHER): Payer: 59

## 2022-03-09 DIAGNOSIS — J309 Allergic rhinitis, unspecified: Secondary | ICD-10-CM

## 2022-03-18 ENCOUNTER — Ambulatory Visit (INDEPENDENT_AMBULATORY_CARE_PROVIDER_SITE_OTHER): Payer: 59

## 2022-03-18 DIAGNOSIS — J309 Allergic rhinitis, unspecified: Secondary | ICD-10-CM

## 2022-03-26 ENCOUNTER — Ambulatory Visit (INDEPENDENT_AMBULATORY_CARE_PROVIDER_SITE_OTHER): Payer: 59

## 2022-03-26 DIAGNOSIS — J309 Allergic rhinitis, unspecified: Secondary | ICD-10-CM | POA: Diagnosis not present

## 2022-04-01 ENCOUNTER — Ambulatory Visit (INDEPENDENT_AMBULATORY_CARE_PROVIDER_SITE_OTHER): Payer: 59

## 2022-04-01 DIAGNOSIS — J309 Allergic rhinitis, unspecified: Secondary | ICD-10-CM | POA: Diagnosis not present

## 2022-04-05 ENCOUNTER — Ambulatory Visit (INDEPENDENT_AMBULATORY_CARE_PROVIDER_SITE_OTHER): Payer: 59

## 2022-04-05 DIAGNOSIS — J309 Allergic rhinitis, unspecified: Secondary | ICD-10-CM | POA: Diagnosis not present

## 2022-04-16 ENCOUNTER — Ambulatory Visit (INDEPENDENT_AMBULATORY_CARE_PROVIDER_SITE_OTHER): Payer: 59

## 2022-04-16 DIAGNOSIS — J309 Allergic rhinitis, unspecified: Secondary | ICD-10-CM | POA: Diagnosis not present

## 2022-04-22 ENCOUNTER — Ambulatory Visit (INDEPENDENT_AMBULATORY_CARE_PROVIDER_SITE_OTHER): Payer: 59

## 2022-04-22 DIAGNOSIS — J309 Allergic rhinitis, unspecified: Secondary | ICD-10-CM | POA: Diagnosis not present

## 2022-04-30 ENCOUNTER — Ambulatory Visit (INDEPENDENT_AMBULATORY_CARE_PROVIDER_SITE_OTHER): Payer: 59

## 2022-04-30 DIAGNOSIS — J309 Allergic rhinitis, unspecified: Secondary | ICD-10-CM

## 2022-05-07 ENCOUNTER — Ambulatory Visit (INDEPENDENT_AMBULATORY_CARE_PROVIDER_SITE_OTHER): Payer: 59

## 2022-05-07 DIAGNOSIS — J309 Allergic rhinitis, unspecified: Secondary | ICD-10-CM

## 2022-05-21 ENCOUNTER — Ambulatory Visit (INDEPENDENT_AMBULATORY_CARE_PROVIDER_SITE_OTHER): Payer: 59

## 2022-05-21 DIAGNOSIS — J309 Allergic rhinitis, unspecified: Secondary | ICD-10-CM

## 2022-05-28 ENCOUNTER — Ambulatory Visit (INDEPENDENT_AMBULATORY_CARE_PROVIDER_SITE_OTHER): Payer: 59

## 2022-05-28 DIAGNOSIS — J309 Allergic rhinitis, unspecified: Secondary | ICD-10-CM

## 2022-06-04 ENCOUNTER — Ambulatory Visit (INDEPENDENT_AMBULATORY_CARE_PROVIDER_SITE_OTHER): Payer: 59

## 2022-06-04 DIAGNOSIS — J309 Allergic rhinitis, unspecified: Secondary | ICD-10-CM

## 2022-06-11 ENCOUNTER — Ambulatory Visit (INDEPENDENT_AMBULATORY_CARE_PROVIDER_SITE_OTHER): Payer: 59

## 2022-06-11 DIAGNOSIS — J309 Allergic rhinitis, unspecified: Secondary | ICD-10-CM | POA: Diagnosis not present

## 2022-06-18 ENCOUNTER — Ambulatory Visit (INDEPENDENT_AMBULATORY_CARE_PROVIDER_SITE_OTHER): Payer: 59 | Admitting: *Deleted

## 2022-06-18 DIAGNOSIS — J309 Allergic rhinitis, unspecified: Secondary | ICD-10-CM

## 2022-06-25 ENCOUNTER — Ambulatory Visit (INDEPENDENT_AMBULATORY_CARE_PROVIDER_SITE_OTHER): Payer: 59

## 2022-06-25 DIAGNOSIS — J309 Allergic rhinitis, unspecified: Secondary | ICD-10-CM | POA: Diagnosis not present

## 2022-07-05 ENCOUNTER — Ambulatory Visit (INDEPENDENT_AMBULATORY_CARE_PROVIDER_SITE_OTHER): Payer: 59

## 2022-07-05 DIAGNOSIS — J309 Allergic rhinitis, unspecified: Secondary | ICD-10-CM

## 2022-07-07 DIAGNOSIS — Z0289 Encounter for other administrative examinations: Secondary | ICD-10-CM

## 2022-07-09 ENCOUNTER — Other Ambulatory Visit: Payer: Self-pay | Admitting: Internal Medicine

## 2022-07-12 ENCOUNTER — Ambulatory Visit (INDEPENDENT_AMBULATORY_CARE_PROVIDER_SITE_OTHER): Payer: 59 | Admitting: Internal Medicine

## 2022-07-12 ENCOUNTER — Encounter: Payer: Self-pay | Admitting: Internal Medicine

## 2022-07-12 VITALS — BP 130/78 | HR 79 | Temp 98.1°F | Resp 16

## 2022-07-12 DIAGNOSIS — J0101 Acute recurrent maxillary sinusitis: Secondary | ICD-10-CM | POA: Diagnosis not present

## 2022-07-12 DIAGNOSIS — T781XXD Other adverse food reactions, not elsewhere classified, subsequent encounter: Secondary | ICD-10-CM

## 2022-07-12 DIAGNOSIS — R0602 Shortness of breath: Secondary | ICD-10-CM | POA: Diagnosis not present

## 2022-07-12 DIAGNOSIS — J3089 Other allergic rhinitis: Secondary | ICD-10-CM | POA: Diagnosis not present

## 2022-07-12 DIAGNOSIS — H1045 Other chronic allergic conjunctivitis: Secondary | ICD-10-CM

## 2022-07-12 DIAGNOSIS — H1013 Acute atopic conjunctivitis, bilateral: Secondary | ICD-10-CM

## 2022-07-12 MED ORDER — AMOXICILLIN-POT CLAVULANATE 875-125 MG PO TABS: 1.0000 | ORAL_TABLET | Freq: Two times a day (BID) | ORAL | 0 refills | Status: DC

## 2022-07-12 NOTE — Patient Instructions (Addendum)
Acute Sinusitis: suspected bacterial - start Augmentin 875, 1 tablet twice daily for 10 days - start baby pack of prednisone, can take full dose in morning to help with sleep - if you feel better on Friday  can return for allergy shots, otherwise wait until the next week - Keep track of sinus infectoin, if you have 4 or more in a year I would like you to see ENT for evaluation of sinus surgery   Allergic rhinitis- Continue allergen avoidance measures directed toward pollens, molds, dust mite, and cat as listed below Continue  Continue an over-the-counter antihistamine once a day as needed for runny nose or itch.  Try Allegra 180 mg daily or twice daily as needed. Continue montelukast 10 mg once a day for control of allergic rhinitis Consider saline nasal rinses as needed for nasal symptoms. Use this before any medicated nasal sprays for best result  Shortness of breath:  - use albuterol 2 puffs every 4 to 6 hours as needed for shortness of breath, wheezing - if needing more than twice a week, please schedule a follow-up  Allergic conjunctivitis Some over the counter eye drops include Pataday one drop in each eye once a day as needed for red, itchy eyes OR Zaditor one drop in each eye twice a day as needed for red itchy eyes.  Food intolerance Avoid any foods that cause you symptoms.  Oral allergy syndrome (apples, grapes Continue to avoid foods that irritate your mouth This may improve over time with your allergy injections.  Dyshidrotic eczema-controlled  Call the clinic if this treatment plan is not working well for you.  Follow up in 6 months or sooner if needed.  It was a pleasure seeing you again in clinic today.

## 2022-07-12 NOTE — Progress Notes (Signed)
Follow Up Note  RE: Corey Pittman MRN: [id number] DOB: Jul 06, 1986 Date of Office Visit: 07/12/2022  Referring provider: Patria Mane, MD Primary care provider: Patria Mane, MD  Chief Complaint: Allergic Rhinitis   History of Present Illness: I had the pleasure of seeing Corey Pittman for a follow up visit at the [facility] on 07/12/2022. He is a 36 y.o. male, who is being followed for allergic rhinitis, dyspnea, allergic conjunctivitis, food intolerance, oral allergy syndrome, dyshidrotic eczema. His previous allergy office visit was on 03/05/2022 with Dr. Maurine Minister. Today is a  acute visit for sinus infection .  History obtained from patient and chart review.  At last visit he reported 2 weeks of increased congestion, drainage and headaches.  He was diagnosed with acute sinus infection was treated with baby prednisone pack and Augmentin.  He reported tolerating his allergy injections and no other new complaints.  Today he presents for follow-up with increased nasal congestion and pressure for 1.5 weeks.  Significantly worsened on Thursday.  Describes sinus tenderness, ear fullness, nasal congestion.  He has tried sinus rinses without good response.  He is continued his Allegra, Singulair and nasal Astelin and Flonase without relief.  Denies any issues with his eczema.  He is currently avoiding his trigger foods for food intolerance.  He was able to eat small amounts of apples and grape without triggers pollen food syndrome recently.  He is due for his allergy shot this week.  Pertinent History/Diagnostics:  - Allergic Rhinitis: SCIT started 12/22/21, had to put on A schedule in silver vial as developed tongue tingling with normal vitals and exam, treated with cetirizine                - SPT environmental panel (11/16/21): grass pollen, weed pollen, tree pollen, mold, dust mite, and cat. - Food Intolerance (unknown triggers)                - SPT select foods (12/09/21):  negative  Assessment and Plan: Corey Pittman is a 36 y.o. male with: Acute recurrent maxillary sinusitis  Other chronic allergic conjunctivitis of both eyes  Other allergic rhinitis  Adverse food reaction, subsequent encounter  Pollen-food allergy, subsequent encounter  Shortness of breath Plan: Patient Instructions  Acute Sinusitis: suspected bacterial - start Augmentin 875, 1 tablet twice daily for 10 days - start baby pack of prednisone, can take full dose in morning to help with sleep - if you feel better on Friday  can return for allergy shots, otherwise wait until the next week - Keep track of sinus infectoin, if you have 4 or more in a year I would like you to see ENT for evaluation of sinus surgery   Allergic rhinitis- Continue allergen avoidance measures directed toward pollens, molds, dust mite, and cat as listed below Continue  Continue an over-the-counter antihistamine once a day as needed for runny nose or itch.  Try Allegra 180 mg daily or twice daily as needed. Continue montelukast 10 mg once a day for control of allergic rhinitis Consider saline nasal rinses as needed for nasal symptoms. Use this before any medicated nasal sprays for best result  Shortness of breath:  - use albuterol 2 puffs every 4 to 6 hours as needed for shortness of breath, wheezing - if needing more than twice a week, please schedule a follow-up  Allergic conjunctivitis Some over the counter eye drops include Pataday one drop in each eye once a day as needed for red,  itchy eyes OR Zaditor one drop in each eye twice a day as needed for red itchy eyes.  Food intolerance Avoid any foods that cause you symptoms.  Oral allergy syndrome (apples, grapes Continue to avoid foods that irritate your mouth This may improve over time with your allergy injections.  Dyshidrotic eczema-controlled  Call the clinic if this treatment plan is not working well for you.  Follow up in 6 months or sooner if  needed.  It was a pleasure seeing you again in clinic today.  No follow-ups on file.  No orders of the defined types were placed in this encounter.   Lab Orders  No laboratory test(s) ordered today   Diagnostics: None performed    Medication List:  Current Outpatient Medications  Medication Sig Dispense Refill   albuterol (VENTOLIN HFA) 108 (90 Base) MCG/ACT inhaler Inhale 2 puffs into the lungs every 6 (six) hours as needed for wheezing or shortness of breath. 8 g 2   azelastine (ASTELIN) 0.1 % nasal spray Place 2 sprays into both nostrils 2 (two) times daily as needed for rhinitis. Use in each nostril as directed 30 mL 6   cetirizine (ZYRTEC) 10 MG tablet Take 10 mg by mouth as needed for allergies.     EPINEPHrine 0.3 mg/0.3 mL IJ SOAJ injection Inject into the muscle.     fluticasone (FLONASE) 50 MCG/ACT nasal spray SPRAY 1 SPRAY INTO EACH NOSTRIL EVERY DAY     ipratropium (ATROVENT) 0.03 % nasal spray Place 2 sprays into both nostrils 3 (three) times daily. 30 mL 5   montelukast (SINGULAIR) 10 MG tablet TAKE 1 TABLET BY MOUTH EVERYDAY AT BEDTIME 90 tablet 0   Multiple Vitamin (MULTI-VITAMIN) tablet Take 1 tablet by mouth daily.     omeprazole (PRILOSEC) 40 MG capsule Take by mouth.     Spacer/Aero-Holding Chambers (OPTICHAMBER DIAMOND-LG MASK) DEVI USE AS DIRECTED AS NEEDED 1 each 1   VITAMIN D PO Take 1,000 mg by mouth daily.     No current facility-administered medications for this visit.   Allergies: No Known Allergies I reviewed his past medical history, social history, family history, and environmental history and no significant changes have been reported from his previous visit.  ROS: All others negative except as noted per HPI.   Objective: BP 130/78   Pulse 79   Temp 98.1 F (36.7 C) (Temporal)   Resp 16   SpO2 97%  There is no height or weight on file to calculate BMI. General Appearance:  Alert, cooperative, no distress, appears stated age  Head:   Normocephalic, without obvious abnormality, atraumatic  Eyes:  Conjunctiva clear, EOM's intact  Nose: Nares normal,  erythematous nasal mucosa with green rhinnorhea , hypertrophic turbinates, no visible anterior polyps, and septum midline, maxiallry and frontal sinus tenderness   Throat: Lips, tongue normal; teeth and gums normal, + cobblestoning  Neck: Supple, symmetrical  Lungs:   clear to auscultation bilaterally, Respirations unlabored, no coughing  Heart:  regular rate and rhythm and no murmur, Appears well perfused  Extremities: No edema  Skin: Skin color, texture, turgor normal, no rashes or lesions on visualized portions of skin  Neurologic: No gross deficits   Previous notes and tests were reviewed. The plan was reviewed with the patient/family, and all questions/concerned were addressed.  It was my pleasure to see Corey Pittman today and participate in his care. Please feel free to contact me with any questions or concerns.  Sincerely,  Ferol Luz, MD  Allergy &  Immunology  [facility] Office: [phone]

## 2022-07-16 ENCOUNTER — Ambulatory Visit (INDEPENDENT_AMBULATORY_CARE_PROVIDER_SITE_OTHER): Payer: 59 | Admitting: *Deleted

## 2022-07-16 DIAGNOSIS — J309 Allergic rhinitis, unspecified: Secondary | ICD-10-CM | POA: Diagnosis not present

## 2022-07-26 ENCOUNTER — Ambulatory Visit (INDEPENDENT_AMBULATORY_CARE_PROVIDER_SITE_OTHER): Payer: 59

## 2022-07-26 DIAGNOSIS — J309 Allergic rhinitis, unspecified: Secondary | ICD-10-CM | POA: Diagnosis not present

## 2022-07-27 ENCOUNTER — Ambulatory Visit (INDEPENDENT_AMBULATORY_CARE_PROVIDER_SITE_OTHER): Payer: 59 | Admitting: Family Medicine

## 2022-07-27 ENCOUNTER — Encounter (INDEPENDENT_AMBULATORY_CARE_PROVIDER_SITE_OTHER): Payer: Self-pay | Admitting: Family Medicine

## 2022-07-27 VITALS — BP 126/82 | HR 73 | Temp 98.2°F | Ht 74.0 in | Wt 289.0 lb

## 2022-07-27 DIAGNOSIS — Z6837 Body mass index (BMI) 37.0-37.9, adult: Secondary | ICD-10-CM

## 2022-07-27 DIAGNOSIS — K219 Gastro-esophageal reflux disease without esophagitis: Secondary | ICD-10-CM

## 2022-07-27 DIAGNOSIS — R0602 Shortness of breath: Secondary | ICD-10-CM | POA: Diagnosis not present

## 2022-07-27 DIAGNOSIS — F5089 Other specified eating disorder: Secondary | ICD-10-CM

## 2022-07-27 DIAGNOSIS — E559 Vitamin D deficiency, unspecified: Secondary | ICD-10-CM

## 2022-07-27 DIAGNOSIS — R5383 Other fatigue: Secondary | ICD-10-CM

## 2022-07-27 DIAGNOSIS — Z9189 Other specified personal risk factors, not elsewhere classified: Secondary | ICD-10-CM

## 2022-07-27 DIAGNOSIS — E7849 Other hyperlipidemia: Secondary | ICD-10-CM | POA: Diagnosis not present

## 2022-07-27 DIAGNOSIS — Z1331 Encounter for screening for depression: Secondary | ICD-10-CM | POA: Diagnosis not present

## 2022-07-27 DIAGNOSIS — Z8249 Family history of ischemic heart disease and other diseases of the circulatory system: Secondary | ICD-10-CM

## 2022-07-28 LAB — CBC WITH DIFFERENTIAL/PLATELET
Basophils Absolute: 0.1 10*3/uL (ref 0.0–0.2)
Basos: 1 %
EOS (ABSOLUTE): 0.2 10*3/uL (ref 0.0–0.4)
Eos: 3 %
Hematocrit: 46.6 % (ref 37.5–51.0)
Hemoglobin: 16.3 g/dL (ref 13.0–17.7)
Immature Grans (Abs): 0.1 10*3/uL (ref 0.0–0.1)
Immature Granulocytes: 1 %
Lymphocytes Absolute: 1.5 10*3/uL (ref 0.7–3.1)
Lymphs: 24 %
MCH: 30 pg (ref 26.6–33.0)
MCHC: 35 g/dL (ref 31.5–35.7)
MCV: 86 fL (ref 79–97)
Monocytes Absolute: 0.7 10*3/uL (ref 0.1–0.9)
Monocytes: 11 %
Neutrophils Absolute: 3.8 10*3/uL (ref 1.4–7.0)
Neutrophils: 60 %
Platelets: 255 10*3/uL (ref 150–450)
RBC: 5.43 x10E6/uL (ref 4.14–5.80)
RDW: 12.5 % (ref 11.6–15.4)
WBC: 6.4 10*3/uL (ref 3.4–10.8)

## 2022-07-28 LAB — COMPREHENSIVE METABOLIC PANEL
ALT: 50 IU/L — ABNORMAL HIGH (ref 0–44)
AST: 26 IU/L (ref 0–40)
Albumin/Globulin Ratio: 1.5 (ref 1.2–2.2)
Albumin: 4.7 g/dL (ref 4.1–5.1)
Alkaline Phosphatase: 67 IU/L (ref 44–121)
BUN/Creatinine Ratio: 15 (ref 9–20)
BUN: 13 mg/dL (ref 6–20)
Bilirubin Total: 0.9 mg/dL (ref 0.0–1.2)
CO2: 24 mmol/L (ref 20–29)
Calcium: 9.7 mg/dL (ref 8.7–10.2)
Chloride: 103 mmol/L (ref 96–106)
Creatinine, Ser: 0.87 mg/dL (ref 0.76–1.27)
Globulin, Total: 3.1 g/dL (ref 1.5–4.5)
Glucose: 96 mg/dL (ref 70–99)
Potassium: 4.6 mmol/L (ref 3.5–5.2)
Sodium: 142 mmol/L (ref 134–144)
Total Protein: 7.8 g/dL (ref 6.0–8.5)
eGFR: 117 mL/min/{1.73_m2} (ref >59)

## 2022-07-28 LAB — LIPID PANEL WITH LDL/HDL RATIO
Cholesterol, Total: 175 mg/dL (ref 100–199)
HDL: 35 mg/dL — ABNORMAL LOW (ref >39)
LDL Chol Calc (NIH): 117 mg/dL — ABNORMAL HIGH (ref 0–99)
LDL/HDL Ratio: 3.3 ratio (ref 0.0–3.6)
Triglycerides: 127 mg/dL (ref 0–149)
VLDL Cholesterol Cal: 23 mg/dL (ref 5–40)

## 2022-07-28 LAB — VITAMIN B12: Vitamin B-12: 928 pg/mL (ref 232–1245)

## 2022-07-28 LAB — TSH: TSH: 1.29 u[IU]/mL (ref 0.450–4.500)

## 2022-07-28 LAB — T4, FREE: Free T4: 1.18 ng/dL (ref 0.82–1.77)

## 2022-07-28 LAB — FOLATE: Folate: >20 ng/mL (ref >3.0)

## 2022-07-28 LAB — VITAMIN D 25 HYDROXY (VIT D DEFICIENCY, FRACTURES): Vit D, 25-Hydroxy: 37.1 ng/mL (ref 30.0–100.0)

## 2022-07-28 LAB — HEMOGLOBIN A1C
Est. average glucose Bld gHb Est-mCnc: 117 mg/dL
Hgb A1c MFr Bld: 5.7 % — ABNORMAL HIGH (ref 4.8–5.6)

## 2022-07-28 LAB — INSULIN, RANDOM: INSULIN: 24.9 u[IU]/mL (ref 2.6–24.9)

## 2022-07-29 NOTE — Progress Notes (Signed)
Chief Complaint:   OBESITY Corey Pittman (MR# [id number]) is a 36 y.o. male who presents for evaluation and treatment of obesity and related comorbidities. Current BMI is Body mass index is 37.11 kg/m. [name] has been struggling with his weight for many years and has been unsuccessful in either losing weight, maintaining weight loss, or reaching his healthy weight goal.  Harding is currently in the action stage of change and ready to dedicate time achieving and maintaining a healthier weight. [name] is interested in becoming our patient and working on intensive lifestyle modifications including (but not limited to) diet and exercise for weight loss.  [name] is the husband of my pt, [name], and works in Airline pilot for [employer]. His worst habit is excessive snacking and poor choices (sweet tooth) while in the car traveling every day.  [name]'s habits were reviewed today and are as follows: His family eats meals together, he thinks his family will eat healthier with him, his desired weight loss is 69 lbs, he has been heavy most of his life, he started gaining weight 8 years ago, his heaviest weight ever was 302 pounds, he has significant food cravings issues, he skips meals frequently, he is frequently drinking liquids with calories, he frequently makes poor food choices, he has problems with excessive hunger, he frequently eats larger portions than normal, he has binge eating behaviors, and he struggles with emotional eating.  Depression Screen [name]'s Food and Mood (modified PHQ-9) score was 15.     07/27/2022    7:59 AM  Depression screen PHQ 2/9  Decreased Interest 2  Down, Depressed, Hopeless 1  PHQ - 2 Score 3  Altered sleeping 3  Tired, decreased energy 3  Change in appetite 1  Feeling bad or failure about yourself  1  Trouble concentrating 2  Moving slowly or fidgety/restless 2  Suicidal thoughts 0  PHQ-9 Score 15  Difficult doing work/chores Somewhat difficult   Subjective:    1. Other fatigue Corey Pittman admits to daytime somnolence and admits to waking up still tired. Patient has a history of symptoms of daytime fatigue, morning fatigue, and morning headache. Corey Pittman generally gets 5 or 6 hours of sleep per night, and states that he has generally restful sleep. Snoring is present. Apneic episodes are not present. Epworth Sleepiness Score is 22. Pt drinks two 16 oz energy drinks daily (i.e. Monster with 300 mg caffeine each).  2. SOB (shortness of breath) on exertion [name] notes increasing shortness of breath with exercising and seems to be worsening over time with weight gain. He notes getting out of breath sooner with activity than he used to. This has gotten worse recently. Corey Pittman denies shortness of breath at rest or orthopnea.  3. Other hyperlipidemia [name] has never been on statin therapy.  4. Vitamin D deficiency His Vit D level was at 28 in February 2023, and PCP started pt on OTC 1000 IU daily.  5. Gastroesophageal reflux disease, unspecified whether esophagitis present Pt with history of dysphagia. He had esophageal stretching 2018-2019. His symptoms are currently well controlled on Omeprazole.  6. Other disorder of eating- emotional eating Corey Pittman does some emotional eating, but he thinks it is out of habit mostly. He denies depression symptoms or anxiety.  7. Family history of early CAD Pt's maternal grandfather had CAD in his 24's and later had a massive acute myocardial infarction and stroke in later years.  8. At risk for heart disease Corey Pittman is at higher than average risk  for cardiovascular disease due to obesity, and family history of hyperlipidemia.  Assessment/Plan:   Orders Placed This Encounter  Procedures   VITAMIN D 25 Hydroxy (Vit-D Deficiency, Fractures)   TSH   T4, free   Lipid Panel With LDL/HDL Ratio   Insulin, random   Hemoglobin A1c   Folate   Comprehensive metabolic panel   CBC with Differential/Platelet   Vitamin B12    EKG 12-Lead    Medications Discontinued During This Encounter  Medication Reason   amoxicillin-clavulanate (AUGMENTIN) 875-125 MG tablet      No orders of the defined types were placed in this encounter.    1. Other fatigue [name] does feel that his weight is causing his energy to be lower than it should be. Fatigue may be related to obesity, depression or many other causes. Labs will be ordered, and in the meanwhile, [name] will focus on self care including making healthy food choices, increasing physical activity and focusing on stress reduction. Refer to sleep medicine, Dr. Tresa Endo of cardiology.  Lab/Orders today: - EKG 12-Lead - TSH - T4, free - Folate - Vitamin B12  2. SOB (shortness of breath) on exertion Majesty does feel that he gets out of breath more easily that he used to when he exercises. Corey Pittman's shortness of breath appears to be obesity related and exercise induced. He has agreed to work on weight loss and gradually increase exercise to treat his exercise induced shortness of breath. Will continue to monitor closely.  3. Other hyperlipidemia Cardiovascular risk and specific lipid/LDL goals reviewed.  We discussed several lifestyle modifications today and [name] will continue to work on diet, exercise and weight loss efforts. Orders and follow up as documented in patient record.   Counseling Intensive lifestyle modifications are the first line treatment for this issue. Dietary changes: Increase soluble fiber. Decrease simple carbohydrates. Exercise changes: Moderate to vigorous-intensity aerobic activity 150 minutes per week if tolerated. Lipid-lowering medications: see documented in medical record.  Lab/Orders today: - Lipid Panel With LDL/HDL Ratio - Insulin, random - Hemoglobin A1c - Comprehensive metabolic panel  4. Vitamin D deficiency Low Vitamin D level contributes to fatigue and are associated with obesity, breast, and colon cancer. He agrees to continue  OTC Vitamin D 1,000 IU daily and will follow-up for routine testing of Vitamin D, at least 2-3 times per year to avoid over-replacement.  Lab/Orders today: - VITAMIN D 25 Hydroxy (Vit-D Deficiency, Fractures)  5. Gastroesophageal reflux disease, unspecified whether esophagitis present Intensive lifestyle modifications are the first line treatment for this issue. We discussed several lifestyle modifications today and he will continue to work on diet, exercise and weight loss efforts. Orders and follow up as documented in patient record. Continue current treatment plan.  Counseling If a person has gastroesophageal reflux disease (GERD), food and stomach acid move back up into the esophagus and cause symptoms or problems such as damage to the esophagus. Anti-reflux measures include: raising the head of the bed, avoiding tight clothing or belts, avoiding eating late at night, not lying down shortly after mealtime, and achieving weight loss. Avoid ASA, NSAID's, caffeine, alcohol, and tobacco.  OTC Pepcid and/or Tums are often very helpful for as needed use.  However, for persisting chronic or daily symptoms, stronger medications like Omeprazole may be needed. You may need to avoid foods and drinks such as: Coffee and tea (with or without caffeine). Drinks that contain alcohol. Energy drinks and sports drinks. Bubbly (carbonated) drinks or sodas. Chocolate and cocoa. Peppermint  and mint flavorings. Garlic and onions. Horseradish. Spicy and acidic foods. These include peppers, chili powder, curry powder, vinegar, hot sauces, and BBQ sauce. Citrus fruit juices and citrus fruits, such as oranges, lemons, and limes. Tomato-based foods. These include red sauce, chili, salsa, and pizza with red sauce. Fried and fatty foods. These include donuts, french fries, potato chips, and high-fat dressings. High-fat meats. These include hot dogs, rib eye steak, sausage, ham, and bacon.  Lab/Orders today: -  CBC with Differential/Platelet  6. Other disorder of eating- emotional eating Behavior modification techniques were discussed today to help Eithen deal with his emotional/non-hunger eating behaviors.  Orders and follow up as documented in patient record. Pt declines referral to Dr. Dewaine Conger but will assess and refer as needed in the future.  7. Family history of early CAD Focus on prudent nutritional plan and check labs.  8. Depression screening Uvaldo had a positive depression screening. Depression is commonly associated with obesity and often results in emotional eating behaviors. We will monitor this closely and work on CBT to help improve the non-hunger eating patterns. Referral to Psychology may be required if no improvement is seen as he continues in our clinic.  9. At risk for heart disease Due to [facility] current state of health and medical condition(s), he is at a higher risk for heart disease.  This puts the patient at much greater risk to subsequently develop cardiopulmonary conditions that can significantly affect patient's quality of life in a negative manner as well.    At least 30 minutes were spent on counseling Halton Neas about these concerns today, and we discussed the importance of reversing risks factors of obesity, especially truncal and visceral fat, hypertension, hyperlipidemia, and pre-diabetes.  The initial goal is to lose at least 5-10% of starting weight to help reduce these risk factors.  Counseling: Intensive lifestyle modifications were discussed with Dian Situ as the most appropriate first line of treatment.  he will continue to work on diet, exercise, and weight loss efforts.  We will continue to reassess these conditions on a fairly regular basis in an attempt to decrease the patient's overall morbidity and mortality.  Evidence-based interventions for health behavior change were utilized today including the discussion of self monitoring techniques,  problem-solving barriers, and SMART goal setting techniques.  Specifically, regarding patient's less desirable eating habits and patterns, we employed the technique of small changes when Samanyu Tinnell has not been able to fully commit to his prudent nutritional plan.  10. Class 2 severe obesity with serious comorbidity and body mass index (BMI) of 37.0 to 37.9 in adult, unspecified obesity type [facility]) Rubert is currently in the action stage of change and his goal is to continue with weight loss efforts. I recommend Yandiel begin the structured treatment plan as follows:  He has agreed to the Category 4 Plan.  Exercise goals:  As is    Behavioral modification strategies: increasing lean protein intake, decreasing simple carbohydrates, avoiding temptations, and planning for success.  He was informed of the importance of frequent follow-up visits to maximize his success with intensive lifestyle modifications for his multiple health conditions. He was informed we would discuss his lab results at his next visit unless there is a critical issue that needs to be addressed sooner. Lenorris agreed to keep his next visit at the agreed upon time to discuss these results.  Objective:   Blood pressure 126/82, pulse 73, temperature 98.2 F (36.8 C), height 6\' 2"  (1.88 m), weight 289  lb (131.1 kg), SpO2 99 %. Body mass index is 37.11 kg/m.  EKG: Normal sinus rhythm, rate 72.  Indirect Calorimeter completed today shows a VO2 of 354 and a REE of 2448.  His calculated basal metabolic rate is 6294 thus his basal metabolic rate is worse than expected.  General: Cooperative, alert, well developed, in no acute distress. HEENT: Conjunctivae and lids unremarkable. Cardiovascular: Regular rhythm.  Lungs: Normal work of breathing. Neurologic: No focal deficits.   Lab Results  Component Value Date   CREATININE 0.87 07/27/2022   BUN 13 07/27/2022   NA 142 07/27/2022   K 4.6 07/27/2022   CL 103 07/27/2022   CO2 24  07/27/2022   Lab Results  Component Value Date   ALT 50 (H) 07/27/2022   AST 26 07/27/2022   ALKPHOS 67 07/27/2022   BILITOT 0.9 07/27/2022   Lab Results  Component Value Date   HGBA1C 5.7 (H) 07/27/2022   Lab Results  Component Value Date   INSULIN 24.9 07/27/2022   Lab Results  Component Value Date   TSH 1.290 07/27/2022   Lab Results  Component Value Date   CHOL 175 07/27/2022   HDL 35 (L) 07/27/2022   LDLCALC 117 (H) 07/27/2022   TRIG 127 07/27/2022   Lab Results  Component Value Date   WBC 6.4 07/27/2022   HGB 16.3 07/27/2022   HCT 46.6 07/27/2022   MCV 86 07/27/2022   PLT 255 07/27/2022     Attestation Statements:   Reviewed by clinician on day of visit: allergies, medications, problem list, medical history, surgical history, family history, social history, and previous encounter notes.  I, Kyung Rudd, BS, CMA, am acting as transcriptionist for [employer], DO.   I have reviewed the above documentation for accuracy and completeness, and I agree with the above. Carlye Grippe, D.O.  The 21st Century Cures Act was signed into law in 2016 which includes the topic of electronic health records.  This provides immediate access to information in MyChart.  This includes consultation notes, operative notes, office notes, lab results and pathology reports.  If you have any questions about what you read please let us know at your next visit so we can discuss your concerns and take corrective action if need be.  We are right here with you.

## 2022-08-02 ENCOUNTER — Ambulatory Visit (INDEPENDENT_AMBULATORY_CARE_PROVIDER_SITE_OTHER): Payer: 59

## 2022-08-02 DIAGNOSIS — J309 Allergic rhinitis, unspecified: Secondary | ICD-10-CM

## 2022-08-04 ENCOUNTER — Encounter (INDEPENDENT_AMBULATORY_CARE_PROVIDER_SITE_OTHER): Payer: Self-pay

## 2022-08-10 ENCOUNTER — Encounter (INDEPENDENT_AMBULATORY_CARE_PROVIDER_SITE_OTHER): Payer: Self-pay | Admitting: Family Medicine

## 2022-08-10 ENCOUNTER — Ambulatory Visit (INDEPENDENT_AMBULATORY_CARE_PROVIDER_SITE_OTHER): Payer: 59 | Admitting: Family Medicine

## 2022-08-10 VITALS — BP 117/82 | HR 69 | Temp 97.8°F | Ht 74.0 in | Wt 286.0 lb

## 2022-08-10 DIAGNOSIS — E7849 Other hyperlipidemia: Secondary | ICD-10-CM | POA: Diagnosis not present

## 2022-08-10 DIAGNOSIS — R638 Other symptoms and signs concerning food and fluid intake: Secondary | ICD-10-CM

## 2022-08-10 DIAGNOSIS — E559 Vitamin D deficiency, unspecified: Secondary | ICD-10-CM

## 2022-08-10 DIAGNOSIS — E669 Obesity, unspecified: Secondary | ICD-10-CM

## 2022-08-10 DIAGNOSIS — F5089 Other specified eating disorder: Secondary | ICD-10-CM

## 2022-08-10 DIAGNOSIS — R7401 Elevation of levels of liver transaminase levels: Secondary | ICD-10-CM

## 2022-08-10 DIAGNOSIS — Z9189 Other specified personal risk factors, not elsewhere classified: Secondary | ICD-10-CM

## 2022-08-10 DIAGNOSIS — Z6836 Body mass index (BMI) 36.0-36.9, adult: Secondary | ICD-10-CM

## 2022-08-10 DIAGNOSIS — R7303 Prediabetes: Secondary | ICD-10-CM

## 2022-08-10 MED ORDER — METFORMIN HCL 500 MG PO TABS: ORAL_TABLET | ORAL | 0 refills | Status: DC

## 2022-08-10 MED ORDER — VITAMIN D (ERGOCALCIFEROL) 1.25 MG (50000 UNIT) PO CAPS: 50000.0000 [IU] | ORAL_CAPSULE | ORAL | 0 refills | Status: DC

## 2022-08-10 NOTE — Addendum Note (Signed)
Addended by: Paulla Fore A on: 08/10/2022 04:04 PM   Modules accepted: Orders

## 2022-08-12 ENCOUNTER — Ambulatory Visit (INDEPENDENT_AMBULATORY_CARE_PROVIDER_SITE_OTHER): Payer: 59

## 2022-08-12 DIAGNOSIS — J309 Allergic rhinitis, unspecified: Secondary | ICD-10-CM

## 2022-08-14 NOTE — Progress Notes (Signed)
Chief Complaint:   OBESITY Corey Pittman is here to discuss his progress with his obesity treatment plan along with follow-up of his obesity related diagnoses. Corey Pittman is on the Category 4 Plan with breakfast options and states he is following his eating plan approximately 75% of the time. Corey Pittman states he is not currently exercising.  Today's visit was #: 2 Starting weight: 289 lbs Starting date: 07/27/2022 Today's weight: 286 lbs Today's date: 08/10/2022 Total lbs lost to date: 3 Total lbs lost since last in-office visit: 3  Interim History: Corey Pittman is here today for his first follow-up office visit since starting the program with [place].  All blood work/ lab tests that were recently ordered by myself or an outside provider were reviewed with patient today per their request.   Extended time was spent counseling him on all new disease processes that were discovered or preexisting ones that are affected by BMI.  he understands that many of these abnormalities will need to monitored regularly along with the current treatment plan of prudent dietary changes, in which we are making each and every office visit, to improve these health parameters. Corey Pittman reports cravings for sweets. He went to the beach this past weekend and ate off plan.  Subjective:   1. Other hyperlipidemia New. Discussed labs with patient today. Pt has no history of hyperlipidemia, therefore is not on a statin.  2. Vitamin D deficiency Worsening. Discussed labs with patient today. He is currently taking OTC vitamin D 1000 IU each day. He denies nausea, vomiting or muscle weakness.  3. Prediabetes New. Discussed labs with patient today. Pt reports sweets cravings "all day long". He has no prior history of prediabetes.  4. Craving for particular food, sweets New. Discussed labs with patient today. Pt with new onset prediabetes and insulin resistance today.  5. Transaminitis New. Discussed labs with patient  today. Corey Pittman's ALT is 50. Approximately 8 years ago, ALT was 32.  6. Other disorder of eating- emotional eating Discussed labs with patient today. Pt finds the emotional eating and mindful eating handouts helpful. He feels symptoms are under good control.  7. At risk of diabetes mellitus Corey Pittman is at risk for diabetes mellitus due to new onset prediabetes.  Assessment/Plan:  No orders of the defined types were placed in this encounter.   There are no discontinued medications.   Meds ordered this encounter  Medications   Vitamin D, Ergocalciferol, (DRISDOL) 1.25 MG (50000 UNIT) CAPS capsule    Sig: Take 1 capsule (50,000 Units total) by mouth every 7 (seven) days.    Dispense:  4 capsule    Refill:  0    30 d supply;  ** OV for RF **   Do not send RF request   metFORMIN (GLUCOPHAGE) 500 MG tablet    Sig: 1/2 tab with brkfast and 1/2 tab po with lunch daily    Dispense:  30 tablet    Refill:  0    30 d supply;  ** OV for RF **   Do not send RF request     1. Other hyperlipidemia HDL is too low and LDL is slightly elevated at 117. Cardiovascular risk and specific lipid/LDL goals reviewed.  We discussed several lifestyle modifications today and Corey Pittman will continue to work on diet, exercise and weight loss efforts. Orders and follow up as documented in patient record.   Counseling Intensive lifestyle modifications are the first line treatment for this issue. Dietary changes: Increase soluble fiber.  Decrease simple carbohydrates. Exercise changes: Moderate to vigorous-intensity aerobic activity 150 minutes per week if tolerated. Lipid-lowering medications: see documented in medical record.  2. Vitamin D deficiency - I discussed the importance of vitamin D to the patient's health and well-being.  - I reviewed possible symptoms of low Vitamin D:  low energy, depressed mood, muscle aches, joint aches, osteoporosis etc. was reviewed with patient - low Vitamin D levels may be linked  to an increased risk of cardiovascular events and even increased risk of cancers- such as colon and breast.  - ideal vitamin D levels reviewed with patient  - I recommend pt take a 50,000 IU weekly prescription vit D - see script below   - Informed patient this may be a lifelong thing, and he was encouraged to continue to take the medicine until told otherwise.    - weight loss will likely improve availability of vitamin D, thus encouraged Corey Pittman to continue with meal plan and their weight loss efforts to further improve this condition.  Thus, we will need to monitor levels regularly (every 3-4 mo on average) to keep levels within normal limits and prevent over supplementation. - pt's questions and concerns regarding this condition addressed.  Start- Vitamin D, Ergocalciferol, (DRISDOL) 1.25 MG (50000 UNIT) CAPS capsule; Take 1 capsule (50,000 Units total) by mouth every 7 (seven) days.  Dispense: 4 capsule; Refill: 0  3. Prediabetes Elevated fasting insulin and A1c at 5.7. Corey Pittman will continue to work on weight loss, exercise, and decreasing simple carbohydrates to help decrease the risk of diabetes. Handouts on prediabetes and Metformin given to pt.  Start- metFORMIN (GLUCOPHAGE) 500 MG tablet; 1/2 tab with brkfast and 1/2 tab po with lunch daily  Dispense: 30 tablet; Refill: 0  4. Craving for particular food, sweets Start low dose Metformin, follow prudent nutritional plan, and decrease simple carbohydrates.  Start- metFORMIN (GLUCOPHAGE) 500 MG tablet; 1/2 tab with brkfast and 1/2 tab po with lunch daily  Dispense: 30 tablet; Refill: 0  5. Transaminitis Plan: Follow prudent nutritional plan. Focus on weight loss and repeat labs in 3-4 months. - pathophysiology of disease discussed with Corey Pittman. Elevated liver transaminases with an ALT predominance combined with obesity and insulin resistance is characteristic, but not diagnostic of non-alcoholic fatty liver disease (NAFLD). NAFLD is  the 2nd leading cause of liver transplant in adults. Treatment includes weight loss, elimination of sweet drinks, including juice, avoidance of high fructose corn syrup, and exercise. As always, avoiding alcohol consumption is important. - Treatment goal: 7-10% reduction of body weight.   - Aerobic activity and resistance training are important to help achieve a healthy body weight BUT also increases peripheral insulin sensitivity, reducing delivery of free fatty acids and glucose to the liver.  Also recommended exercise to increase intrahepatic fatty acid oxidation, decrease fatty acid synthesis, and help prevent mitochondrial and hepatocellular damage. - Medications that can help reduce hepatic fat include metformin and GLP-1's, which were discussed with pt and prescribed if pt agreed to treatment.   6. Other disorder of eating- emotional eating Behavior modification techniques were discussed today to help Corey Pittman deal with his emotional/non-hunger eating behaviors.  Orders and follow up as documented in patient record. Pt declines referral to Dr. Dewaine Conger.  7. At risk of diabetes mellitus - Corey Pittman was given diabetes prevention education and counseling today of more than 25 minutes.  - Counseled patient on pathophysiology of disease and meaning/ implication of lab results.  - Reviewed how certain foods can  either stimulate or inhibit insulin release, and subsequently affect hunger pathways  - Importance of following a healthy meal plan with limiting amounts of simple carbohydrates discussed with patient - Effects of regular aerobic exercise on blood sugar regulation reviewed and encouraged an eventual goal of 30 min 5d/week or more as a minimum.  - Briefly discussed treatment options, which always include dietary and lifestyle modification as first line.   - Handouts provided at patient's desire and/or told to go online to the American Diabetes Association website for further information.  8.  Obesity, current BMI 36.7 Corey Pittman is currently in the action stage of change. As such, his goal is to continue with weight loss efforts. He has agreed to the Category 4 Plan with breakfast options.   Increase water intake to 20 oz more than current intake. Goal is to eventually have over a gallon of water a day. Referral to sleep med that was made at last OV never went through. We will resend referral today.  Exercise goals:  As is  Behavioral modification strategies: keeping healthy foods in the home and planning for success.  Corey Pittman has agreed to follow-up with our clinic in 2-3 weeks. He was informed of the importance of frequent follow-up visits to maximize his success with intensive lifestyle modifications for his multiple health conditions.   Objective:   Blood pressure 117/82, pulse 69, temperature 97.8 F (36.6 C), height 6\' 2"  (1.88 m), weight 286 lb (129.7 kg), SpO2 97 %. Body mass index is 36.72 kg/m.  General: Cooperative, alert, well developed, in no acute distress. HEENT: Conjunctivae and lids unremarkable. Cardiovascular: Regular rhythm.  Lungs: Normal work of breathing. Neurologic: No focal deficits.   Lab Results  Component Value Date   CREATININE 0.87 07/27/2022   BUN 13 07/27/2022   NA 142 07/27/2022   K 4.6 07/27/2022   CL 103 07/27/2022   CO2 24 07/27/2022   Lab Results  Component Value Date   ALT 50 (H) 07/27/2022   AST 26 07/27/2022   ALKPHOS 67 07/27/2022   BILITOT 0.9 07/27/2022   Lab Results  Component Value Date   HGBA1C 5.7 (H) 07/27/2022   Lab Results  Component Value Date   INSULIN 24.9 07/27/2022   Lab Results  Component Value Date   TSH 1.290 07/27/2022   Lab Results  Component Value Date   CHOL 175 07/27/2022   HDL 35 (L) 07/27/2022   LDLCALC 117 (H) 07/27/2022   TRIG 127 07/27/2022   Lab Results  Component Value Date   VD25OH 37.1 07/27/2022   Lab Results  Component Value Date   WBC 6.4 07/27/2022   HGB 16.3 07/27/2022    HCT 46.6 07/27/2022   MCV 86 07/27/2022   PLT 255 07/27/2022    Attestation Statements:   Reviewed by clinician on day of visit: allergies, medications, problem list, medical history, surgical history, family history, social history, and previous encounter notes.  I, 09/26/2022, BS, CMA, am acting as transcriptionist for Kyung Rudd, DO.   I have reviewed the above documentation for accuracy and completeness, and I agree with the above. [employer], D.O.  The 21st Century Cures Act was signed into law in 2016 which includes the topic of electronic health records.  This provides immediate access to information in MyChart.  This includes consultation notes, operative notes, office notes, lab results and pathology reports.  If you have any questions about what you read please let 2017 know at your next visit  so we can discuss your concerns and take corrective action if need be.  We are right here with you.

## 2022-08-16 ENCOUNTER — Encounter (INDEPENDENT_AMBULATORY_CARE_PROVIDER_SITE_OTHER): Payer: Self-pay | Admitting: Family Medicine

## 2022-08-19 NOTE — Progress Notes (Signed)
FOLLOW UP Date of Service/Encounter:  08/20/22   Subjective:  Corey Pittman (DOB: 1986/02/12) is a 36 y.o. male who [facility] in re-evaluation of the following: allergic rhinitis and conjunctivitis on AIT, food intolerance, dyshidrotic eczema, history of shortness of breath History obtained from: chart review and patient.  For Review, LV was on 07/12/22  with Dr. Marlynn Perking seen for acute visit for sinusitis .  He was treated with Augmentin 875 mg twice daily x10 days as well as prednisone.  Today presents for follow-up. He is using sinus rinses daily.  He is taking Allegra every morning since he started mowing grass in the evening, not using his Singulair consistently.  Cannot really notice a difference when he does not take the Singulair. He is starting to notice improvement since starting injections. He is now able to eat apples with out peeling them and is able to tolerate except [place] apples.  He does feel that his allergies are improving. He is tolerating his injections, but did have to go down a dose a few weeks ago due to allergic reaction.  However he has consistently built up since then without issues. He did have a flare of his hand eczema last week. He went to the beach and was in a pool with a lot of chlorine. Once he came home, his hands resolved. He does not need any refills at this time. He has not had any shortness of breath or required albuterol since his previous visit.  -------------------------------------------------- Pertinent History/Diagnostics:  - Allergic Rhinitis: SCIT started 12/22/21, had to put on A schedule in silver vial as developed tongue tingling with normal vitals and exam, treated with cetirizine                - SPT environmental panel (11/16/21): grass pollen, weed pollen, tree pollen, mold, dust mite, and cat. - Food Intolerance (unknown triggers)                - SPT select foods (12/09/21):  negative  Allergies as of 08/20/2022   No Known Allergies      Medication List        Accurate as of August 20, 2022 10:37 AM. If you have any questions, ask your nurse or doctor.          STOP taking these medications    cetirizine 10 MG tablet Commonly known as: ZYRTEC Stopped by: Verlee Monte, MD   Garlic 1000 MG Caps Stopped by: Verlee Monte, MD   VITAMIN D PO Stopped by: Verlee Monte, MD       TAKE these medications    albuterol 108 (90 Base) MCG/ACT inhaler Commonly known as: VENTOLIN HFA Inhale 2 puffs into the lungs every 6 (six) hours as needed for wheezing or shortness of breath.   azelastine 0.1 % nasal spray Commonly known as: ASTELIN Place 2 sprays into both nostrils 2 (two) times daily as needed for rhinitis. Use in each nostril as directed   EPINEPHrine 0.3 mg/0.3 mL Soaj injection Commonly known as: EPI-PEN Inject into the muscle.   fexofenadine 180 MG tablet Commonly known as: ALLEGRA Take 180 mg by mouth daily.   fluticasone 50 MCG/ACT nasal spray Commonly known as: FLONASE SPRAY 1 SPRAY INTO EACH NOSTRIL EVERY DAY   ipratropium 0.03 % nasal spray Commonly known as: ATROVENT Place 2 sprays into both nostrils 3 (three) times daily.   metFORMIN 500 MG tablet Commonly known as:  GLUCOPHAGE 1/2 tab with brkfast and 1/2 tab po with lunch daily   montelukast 10 MG tablet Commonly known as: SINGULAIR TAKE 1 TABLET BY MOUTH EVERYDAY AT BEDTIME   Multi-Vitamin tablet Take 1 tablet by mouth daily.   omeprazole 40 MG capsule Commonly known as: PRILOSEC Take by mouth.   OptiChamber Diamond-Lg Mask Devi USE AS DIRECTED AS NEEDED   PROBIOTIC-10 PO Take by mouth daily at 12 noon.   Vitamin D (Ergocalciferol) 1.25 MG (50000 UNIT) Caps capsule Commonly known as: DRISDOL Take 1 capsule (50,000 Units total) by mouth every 7 (seven) days.       Past Medical History:  Diagnosis Date   Acid reflux    Back pain    Chronic headaches     Edema of both lower extremities    Fatigue    GERD (gastroesophageal reflux disease)    High cholesterol    IBS (irritable bowel syndrome)    Joint pain    SOB (shortness of breath)    Swallowing difficulty    Vitamin D deficiency    Vitamin D deficiency    Past Surgical History:  Procedure Laterality Date   ADENOIDECTOMY     Otherwise, there have been no changes to his past medical history, surgical history, family history, or social history.  ROS: All others negative except as noted per HPI.   Objective:  BP 110/72   Pulse 61   Temp 98.1 F (36.7 C) (Temporal)   Resp 16   SpO2 98%  There is no height or weight on file to calculate BMI. Physical Exam: General Appearance:  Alert, cooperative, no distress, appears stated age  Head:  Normocephalic, without obvious abnormality, atraumatic  Eyes:  Conjunctiva clear, EOM's intact  Nose: Nares normal, hypertrophic turbinates, normal mucosa, and no visible anterior polyps  Throat: Lips, tongue normal; teeth and gums normal, normal posterior oropharynx  Neck: Supple, symmetrical  Lungs:   clear to auscultation bilaterally, Respirations unlabored, no coughing  Heart:  regular rate and rhythm and no murmur, Appears well perfused  Extremities: No edema  Skin: Skin color, texture, turgor normal, no rashes or lesions on visualized portions of skin  Neurologic: No gross deficits  Allergy injection in clinic today.  Assessment/Plan   Allergic rhinitis-controlled Continue allergen avoidance measures directed toward pollens, molds, dust mite, and cat as listed below Continue allergy injections per protocol, bring your up to date Epipen to allergy injection appointments Allegra 180 mg daily or twice daily as needed. Trial off montelukast, if symptoms worsen, restart 10 mg once a day for control of allergic rhinitis Continue sinus rinses daily or as needed Consider saline nasal rinses as needed for nasal symptoms. Use this before  any medicated nasal sprays for best result  Shortness of breath: controlled - use albuterol 2 puffs every 4 to 6 hours as needed for shortness of breath, wheezing - if needing more than twice a week, please schedule a follow-up  Allergic conjunctivitis-controlled Some over the counter eye drops include Pataday one drop in each eye once a day as needed for red, itchy eyes OR Zaditor one drop in each eye twice a day as needed for red itchy eyes.  Food intolerance-stable Avoid any foods that cause you symptoms.  Oral allergy syndrome (apples, grapes, etc)-improving since starting allergy injections Continue to avoid foods that irritate your mouth This may improve over time with your allergy injections.  Dyshidrotic eczema-controlled  Call the clinic if this treatment plan is not working  well for you.  Follow up in 12 months or sooner if needed.  It was a pleasure seeing you again in clinic today.  Tonny Bollman, MD  [facility]

## 2022-08-20 ENCOUNTER — Ambulatory Visit (INDEPENDENT_AMBULATORY_CARE_PROVIDER_SITE_OTHER): Payer: 59 | Admitting: Internal Medicine

## 2022-08-20 ENCOUNTER — Encounter: Payer: Self-pay | Admitting: Internal Medicine

## 2022-08-20 VITALS — BP 110/72 | HR 61 | Temp 98.1°F | Resp 16

## 2022-08-20 DIAGNOSIS — J302 Other seasonal allergic rhinitis: Secondary | ICD-10-CM

## 2022-08-20 DIAGNOSIS — H1013 Acute atopic conjunctivitis, bilateral: Secondary | ICD-10-CM | POA: Diagnosis not present

## 2022-08-20 DIAGNOSIS — R0602 Shortness of breath: Secondary | ICD-10-CM

## 2022-08-20 DIAGNOSIS — L301 Dyshidrosis [pompholyx]: Secondary | ICD-10-CM

## 2022-08-20 DIAGNOSIS — T781XXD Other adverse food reactions, not elsewhere classified, subsequent encounter: Secondary | ICD-10-CM

## 2022-08-20 DIAGNOSIS — J309 Allergic rhinitis, unspecified: Secondary | ICD-10-CM | POA: Diagnosis not present

## 2022-08-20 NOTE — Patient Instructions (Addendum)
Allergic rhinitis- Continue allergen avoidance measures directed toward pollens, molds, dust mite, and cat as listed below Continue allergy injections per protocol, bring your up to date Epipen to allergy injection appointments Allegra 180 mg daily or twice daily as needed. Trial off montelukast, if symptoms worsen, restart 10 mg once a day for control of allergic rhinitis Continue sinus rinses daily or as needed Consider saline nasal rinses as needed for nasal symptoms. Use this before any medicated nasal sprays for best result  Shortness of breath:  - use albuterol 2 puffs every 4 to 6 hours as needed for shortness of breath, wheezing - if needing more than twice a week, please schedule a follow-up  Allergic conjunctivitis Some over the counter eye drops include Pataday one drop in each eye once a day as needed for red, itchy eyes OR Zaditor one drop in each eye twice a day as needed for red itchy eyes.  Food intolerance Avoid any foods that cause you symptoms.  Oral allergy syndrome (apples, grapes, etc) Continue to avoid foods that irritate your mouth This may improve over time with your allergy injections.  Dyshidrotic eczema-controlled  Call the clinic if this treatment plan is not working well for you.  Follow up in 12 months or sooner if needed.  It was a pleasure seeing you again in clinic today.

## 2022-08-24 ENCOUNTER — Ambulatory Visit (INDEPENDENT_AMBULATORY_CARE_PROVIDER_SITE_OTHER): Payer: 59 | Admitting: Family Medicine

## 2022-08-24 ENCOUNTER — Encounter (INDEPENDENT_AMBULATORY_CARE_PROVIDER_SITE_OTHER): Payer: Self-pay | Admitting: Family Medicine

## 2022-08-24 VITALS — BP 122/86 | HR 60 | Temp 98.1°F | Ht 74.0 in | Wt 280.0 lb

## 2022-08-24 DIAGNOSIS — E559 Vitamin D deficiency, unspecified: Secondary | ICD-10-CM

## 2022-08-24 DIAGNOSIS — R7303 Prediabetes: Secondary | ICD-10-CM | POA: Diagnosis not present

## 2022-08-24 DIAGNOSIS — R400 Somnolence: Secondary | ICD-10-CM

## 2022-08-24 DIAGNOSIS — R638 Other symptoms and signs concerning food and fluid intake: Secondary | ICD-10-CM

## 2022-08-24 DIAGNOSIS — E669 Obesity, unspecified: Secondary | ICD-10-CM | POA: Diagnosis not present

## 2022-08-24 DIAGNOSIS — Z6835 Body mass index (BMI) 35.0-35.9, adult: Secondary | ICD-10-CM

## 2022-08-24 MED ORDER — METFORMIN HCL 500 MG PO TABS
ORAL_TABLET | ORAL | 0 refills | Status: DC
Start: 2022-08-24 — End: 2022-09-13

## 2022-08-24 MED ORDER — VITAMIN D (ERGOCALCIFEROL) 1.25 MG (50000 UNIT) PO CAPS: 50000.0000 [IU] | ORAL_CAPSULE | ORAL | 0 refills | Status: DC

## 2022-09-01 ENCOUNTER — Telehealth: Payer: Self-pay

## 2022-09-01 ENCOUNTER — Other Ambulatory Visit: Payer: Self-pay

## 2022-09-01 ENCOUNTER — Other Ambulatory Visit: Payer: Self-pay | Admitting: Internal Medicine

## 2022-09-01 MED ORDER — PAXLOVID (150/100) 10 X 150 MG & 10 X 100MG PO TBPK: ORAL_TABLET | ORAL | 0 refills | Status: DC

## 2022-09-01 NOTE — Telephone Encounter (Signed)
Paxlovid sent in for pt

## 2022-09-01 NOTE — Telephone Encounter (Signed)
Please advise 

## 2022-09-01 NOTE — Telephone Encounter (Signed)
Please send in paxlovid for him. Thanks.

## 2022-09-01 NOTE — Progress Notes (Signed)
Chief Complaint:   OBESITY Corey Pittman is here to discuss his progress with his obesity treatment plan along with follow-up of his obesity related diagnoses. Corey Pittman is on the Category 4 Plan and states he is following his eating plan approximately 75% of the time. Corey Pittman states he is not currently exercising.  Today's visit was #: 3 Starting weight: 289 lbs Starting date: 07/27/2022 Today's weight: 280 lbs Today's date: 08/24/2022 Total lbs lost to date: 9 Total lbs lost since last in-office visit: 6  Interim History: Corey Pittman lost over 5 lbs in fat mass versus his last OV. Last week, he didn't eat everything on the meal plan for several days because of a very busy schedule.  Subjective:   1. Prediabetes Pt started Metformin at last OV and is tolerating it well, except for the days he had pizza or ate out and off plan.  2. Vitamin D deficiency He was also started on Ergocalciferol at last OV and has felt an increase in energy.  3. Daytime sleepiness Corey Pittman has an elevated ESS, but he is unable to get in to cardiology until the end of October. Pt requests another referral.  Assessment/Plan:   Orders Placed This Encounter  Procedures   Ambulatory referral to Neurology    Medications Discontinued During This Encounter  Medication Reason   Vitamin D, Ergocalciferol, (DRISDOL) 1.25 MG (50000 UNIT) CAPS capsule Reorder   metFORMIN (GLUCOPHAGE) 500 MG tablet Reorder     Meds ordered this encounter  Medications   Vitamin D, Ergocalciferol, (DRISDOL) 1.25 MG (50000 UNIT) CAPS capsule    Sig: Take 1 capsule (50,000 Units total) by mouth every 7 (seven) days.    Dispense:  4 capsule    Refill:  0    30 d supply;  ** OV for RF **   Do not send RF request   metFORMIN (GLUCOPHAGE) 500 MG tablet    Sig: 1/2 tab with brkfast and 1/2 tab po with lunch daily    Dispense:  30 tablet    Refill:  0    30 d supply;  ** OV for RF **   Do not send RF request     1. Prediabetes Corey Pittman will  continue to work on weight loss, exercise, and decreasing simple carbohydrates to help decrease the risk of diabetes.   Refill- metFORMIN (GLUCOPHAGE) 500 MG tablet; 1/2 tab with brkfast and 1/2 tab po with lunch daily  Dispense: 30 tablet; Refill: 0  2. Vitamin D deficiency Low Vitamin D level contributes to fatigue and are associated with obesity, breast, and colon cancer. He agrees to continue to take prescription Vitamin D @50 ,000 IU every week and will follow-up for routine testing of Vitamin D, at least 2-3 times per year to avoid over-replacement.  Refill- Vitamin D, Ergocalciferol, (DRISDOL) 1.25 MG (50000 UNIT) CAPS capsule; Take 1 capsule (50,000 Units total) by mouth every 7 (seven) days.  Dispense: 4 capsule; Refill: 0  3. Daytime sleepiness Refer to sleep med at [facility].  - Ambulatory referral to Neurology  4. Obesity, current BMI 35.9 Corey Pittman is currently in the action stage of change. As such, his goal is to continue with weight loss efforts. He has agreed to the Category 4 Plan with breakfast and lunch options or journal breakfast.   Exercise goals:  As is  Behavioral modification strategies: planning for success.  Corey Pittman has agreed to follow-up with our clinic in 3 weeks. He was informed of the importance of frequent follow-up  visits to maximize his success with intensive lifestyle modifications for his multiple health conditions.   Objective:   Blood pressure 122/86, pulse 60, temperature 98.1 F (36.7 C), height 6\' 2"  (1.88 m), weight 280 lb (127 kg), SpO2 98 %. Body mass index is 35.95 kg/m.  General: Cooperative, alert, well developed, in no acute distress. HEENT: Conjunctivae and lids unremarkable. Cardiovascular: Regular rhythm.  Lungs: Normal work of breathing. Neurologic: No focal deficits.   Lab Results  Component Value Date   CREATININE 0.87 07/27/2022   BUN 13 07/27/2022   NA 142 07/27/2022   K 4.6 07/27/2022   CL 103 07/27/2022   CO2 24 07/27/2022    Lab Results  Component Value Date   ALT 50 (H) 07/27/2022   AST 26 07/27/2022   ALKPHOS 67 07/27/2022   BILITOT 0.9 07/27/2022   Lab Results  Component Value Date   HGBA1C 5.7 (H) 07/27/2022   Lab Results  Component Value Date   INSULIN 24.9 07/27/2022   Lab Results  Component Value Date   TSH 1.290 07/27/2022   Lab Results  Component Value Date   CHOL 175 07/27/2022   HDL 35 (L) 07/27/2022   LDLCALC 117 (H) 07/27/2022   TRIG 127 07/27/2022   Lab Results  Component Value Date   VD25OH 37.1 07/27/2022   Lab Results  Component Value Date   WBC 6.4 07/27/2022   HGB 16.3 07/27/2022   HCT 46.6 07/27/2022   MCV 86 07/27/2022   PLT 255 07/27/2022    Attestation Statements:   Reviewed by clinician on day of visit: allergies, medications, problem list, medical history, surgical history, family history, social history, and previous encounter notes.  I, 09/26/2022, BS, CMA, am acting as transcriptionist for Kyung Rudd, DO.  I have reviewed the above documentation for accuracy and completeness, and I agree with the above. [employer], D.O.  The 21st Century Cures Act was signed into law in 2016 which includes the topic of electronic health records.  This provides immediate access to information in MyChart.  This includes consultation notes, operative notes, office notes, lab results and pathology reports.  If you have any questions about what you read please let 2017 know at your next visit so we can discuss your concerns and take corrective action if need be.  We are right here with you.

## 2022-09-01 NOTE — Telephone Encounter (Signed)
Lots of congestion, fever of 101.5, body aches, at home test was positive. He is doing all current meds.

## 2022-09-03 ENCOUNTER — Other Ambulatory Visit (INDEPENDENT_AMBULATORY_CARE_PROVIDER_SITE_OTHER): Payer: Self-pay | Admitting: Family Medicine

## 2022-09-03 DIAGNOSIS — R7303 Prediabetes: Secondary | ICD-10-CM

## 2022-09-13 ENCOUNTER — Ambulatory Visit (INDEPENDENT_AMBULATORY_CARE_PROVIDER_SITE_OTHER): Payer: 59 | Admitting: Family Medicine

## 2022-09-13 VITALS — BP 127/84 | HR 69 | Temp 97.9°F | Ht 74.0 in | Wt 277.0 lb

## 2022-09-13 DIAGNOSIS — Z6835 Body mass index (BMI) 35.0-35.9, adult: Secondary | ICD-10-CM

## 2022-09-13 DIAGNOSIS — E669 Obesity, unspecified: Secondary | ICD-10-CM | POA: Diagnosis not present

## 2022-09-13 DIAGNOSIS — R7303 Prediabetes: Secondary | ICD-10-CM | POA: Diagnosis not present

## 2022-09-13 DIAGNOSIS — Z6837 Body mass index (BMI) 37.0-37.9, adult: Secondary | ICD-10-CM

## 2022-09-13 DIAGNOSIS — E559 Vitamin D deficiency, unspecified: Secondary | ICD-10-CM

## 2022-09-13 MED ORDER — METFORMIN HCL 500 MG PO TABS: ORAL_TABLET | ORAL | 0 refills | Status: DC

## 2022-09-13 MED ORDER — VITAMIN D (ERGOCALCIFEROL) 1.25 MG (50000 UNIT) PO CAPS: 50000.0000 [IU] | ORAL_CAPSULE | ORAL | 0 refills | Status: DC

## 2022-09-15 ENCOUNTER — Encounter (INDEPENDENT_AMBULATORY_CARE_PROVIDER_SITE_OTHER): Payer: Self-pay | Admitting: Family Medicine

## 2022-09-17 ENCOUNTER — Ambulatory Visit (INDEPENDENT_AMBULATORY_CARE_PROVIDER_SITE_OTHER): Payer: 59

## 2022-09-17 DIAGNOSIS — J309 Allergic rhinitis, unspecified: Secondary | ICD-10-CM | POA: Diagnosis not present

## 2022-09-21 NOTE — Progress Notes (Signed)
Chief Complaint:   OBESITY Corey Pittman is here to discuss his progress with his obesity treatment plan along with follow-up of his obesity related diagnoses. Corey Pittman is on the Category 4 Plan with breakfast and lunch options or journal breakfast and states he is following his eating plan approximately 75% of the time. Corey Pittman states he is not currently exercising.  Today's visit was #: 4 Starting weight: 289 lbs Starting date: 07/27/2022 Today's weight: 277 lbs Today's date: 09/13/2022 Total lbs lost to date: 12 Total lbs lost since last in-office visit: 3  Interim History: Corey Pittman was sick with COVID 2 weeks ago. He had Paxlovid and symptoms resolved within 5-7 days.   Subjective:   1. Prediabetes Corey Pittman has a diagnosis of prediabetes based on his elevated HgA1c and was informed this puts him at greater risk of developing diabetes. He continues to work on diet and exercise to decrease his risk of diabetes. He denies nausea or hypoglycemia.  2. Vitamin D deficiency He is currently taking prescription vitamin D 50,000 IU each week. He denies nausea, vomiting or muscle weakness.   Assessment/Plan:  No orders of the defined types were placed in this encounter.   Medications Discontinued During This Encounter  Medication Reason   Vitamin D, Ergocalciferol, (DRISDOL) 1.25 MG (50000 UNIT) CAPS capsule Reorder   metFORMIN (GLUCOPHAGE) 500 MG tablet Reorder     Meds ordered this encounter  Medications   metFORMIN (GLUCOPHAGE) 500 MG tablet    Sig: 1 tab with brkfast and 1 tab po with lunch daily    Dispense:  60 tablet    Refill:  0    30 d supply;  ** OV for RF **   Do not send RF request   Vitamin D, Ergocalciferol, (DRISDOL) 1.25 MG (50000 UNIT) CAPS capsule    Sig: Take 1 capsule (50,000 Units total) by mouth every 7 (seven) days.    Dispense:  4 capsule    Refill:  0    30 d supply;  ** OV for RF **   Do not send RF request     1. Prediabetes Jaimeson will continue to work  on weight loss, exercise, and decreasing simple carbohydrates to help decrease the risk of diabetes.   Refill- metFORMIN (GLUCOPHAGE) 500 MG tablet; 1 tab with brkfast and 1 tab po with lunch daily  Dispense: 60 tablet; Refill: 0  2. Vitamin D deficiency Low Vitamin D level contributes to fatigue and are associated with obesity, breast, and colon cancer. He agrees to continue to take prescription Vitamin D @50 ,000 IU every week and will follow-up for routine testing of Vitamin D, at least 2-3 times per year to avoid over-replacement.  Refill- Vitamin D, Ergocalciferol, (DRISDOL) 1.25 MG (50000 UNIT) CAPS capsule; Take 1 capsule (50,000 Units total) by mouth every 7 (seven) days.  Dispense: 4 capsule; Refill: 0  3. Obesity, current BMI 35.6 Corey Pittman is currently in the action stage of change. As such, his goal is to continue with weight loss efforts. He has agreed to the Category 4 Plan with breakfast and lunch options or journal breakfast.   Exercise goals:  As is- Start walking 10 minutes every other day as tolerated.  Behavioral modification strategies: planning for success.  Corey Pittman has agreed to follow-up with our clinic in 3 weeks. He was informed of the importance of frequent follow-up visits to maximize his success with intensive lifestyle modifications for his multiple health conditions.    Objective:   Blood  pressure 127/84, pulse 69, temperature 97.9 F (36.6 C), height 6\' 2"  (1.88 m), weight 277 lb (125.6 kg), SpO2 99 %. Body mass index is 35.56 kg/m.  General: Cooperative, alert, well developed, in no acute distress. HEENT: Conjunctivae and lids unremarkable. Cardiovascular: Regular rhythm.  Lungs: Normal work of breathing. Neurologic: No focal deficits.   Lab Results  Component Value Date   CREATININE 0.87 07/27/2022   BUN 13 07/27/2022   NA 142 07/27/2022   K 4.6 07/27/2022   CL 103 07/27/2022   CO2 24 07/27/2022   Lab Results  Component Value Date   ALT 50 (H)  07/27/2022   AST 26 07/27/2022   ALKPHOS 67 07/27/2022   BILITOT 0.9 07/27/2022   Lab Results  Component Value Date   HGBA1C 5.7 (H) 07/27/2022   Lab Results  Component Value Date   INSULIN 24.9 07/27/2022   Lab Results  Component Value Date   TSH 1.290 07/27/2022   Lab Results  Component Value Date   CHOL 175 07/27/2022   HDL 35 (L) 07/27/2022   LDLCALC 117 (H) 07/27/2022   TRIG 127 07/27/2022   Lab Results  Component Value Date   VD25OH 37.1 07/27/2022   Lab Results  Component Value Date   WBC 6.4 07/27/2022   HGB 16.3 07/27/2022   HCT 46.6 07/27/2022   MCV 86 07/27/2022   PLT 255 07/27/2022    Attestation Statements:   Reviewed by clinician on day of visit: allergies, medications, problem list, medical history, surgical history, family history, social history, and previous encounter notes.  I, Kathlene November, BS, CMA, am acting as transcriptionist for [employer], DO.   I have reviewed the above documentation for accuracy and completeness, and I agree with the above. Marjory Sneddon, D.O.  The [place] was signed into law in 2016 which includes the topic of electronic health records.  This provides immediate access to information in MyChart.  This includes consultation notes, operative notes, office notes, lab results and pathology reports.  If you have any questions about what you read please let us know at your next visit so we can discuss your concerns and take corrective action if need be.  We are right here with you.

## 2022-09-29 ENCOUNTER — Ambulatory Visit (INDEPENDENT_AMBULATORY_CARE_PROVIDER_SITE_OTHER): Payer: 59 | Admitting: Neurology

## 2022-09-29 ENCOUNTER — Encounter: Payer: Self-pay | Admitting: Neurology

## 2022-09-29 VITALS — BP 135/82 | HR 70 | Ht 75.0 in | Wt 280.8 lb

## 2022-09-29 DIAGNOSIS — R0683 Snoring: Secondary | ICD-10-CM | POA: Diagnosis not present

## 2022-09-29 DIAGNOSIS — R519 Headache, unspecified: Secondary | ICD-10-CM

## 2022-09-29 DIAGNOSIS — G4719 Other hypersomnia: Secondary | ICD-10-CM

## 2022-09-29 DIAGNOSIS — E669 Obesity, unspecified: Secondary | ICD-10-CM

## 2022-09-29 DIAGNOSIS — Z9189 Other specified personal risk factors, not elsewhere classified: Secondary | ICD-10-CM

## 2022-09-29 DIAGNOSIS — R400 Somnolence: Secondary | ICD-10-CM | POA: Diagnosis not present

## 2022-09-29 DIAGNOSIS — G4730 Sleep apnea, unspecified: Secondary | ICD-10-CM

## 2022-09-29 DIAGNOSIS — Z820 Family history of epilepsy and other diseases of the nervous system: Secondary | ICD-10-CM

## 2022-09-29 NOTE — Patient Instructions (Signed)

## 2022-09-29 NOTE — Progress Notes (Signed)
Subjective:    Patient ID: Corey Pittman is a 36 y.o. male.  HPI    Huston Foley, MD, PhD [facility] [address], [address] [address] [place], [place] [address]  Dear Dr. Sharee Holster,  I saw your patient, Corey Pittman, upon your kind request in my sleep clinic today for initial consultation of his sleep disorder, in particular, concern for underlying obstructive sleep apnea.  The patient is unaccompanied today.  As you know, [name] is a 36 year old right-handed gentleman with an underlying medical history of prediabetes, vitamin D deficiency, reflux disease, recurrent headaches, hyperlipidemia, irritable bowel syndrome, back pain, and obesity, who reports snoring and excessive daytime somnolence.  I reviewed your office note from 08/24/2022.  His Epworth sleepiness score is 21 out of 24, fatigue severity score is 48 out of 63.  He has come close to falling asleep at the wheel, never fell asleep while driving.  His bedtime is around midnight and rise time around 5:30 AM or 6 AM.  He lives with his family which includes his wife and 78-year-old son as well as 24-month-old son.  The little 1 has a crib in their bedroom but sleeps fairly well through the night.  They have a small dog in the household, the dog tends to sleep on the bed with them.  They do not have a TV in the bedroom.  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  His father has sleep apnea.  He is familiar with the diagnosis as his wife also has sleep apnea.  He has woken up with a headache occasionally and has to take ibuprofen up to 800 mg, typically only once in the morning.  He is a non-smoker and does not drink any alcohol, he does drink caffeine in the form of energy drinks, 1 or 2/day on average.  He denies any night to night nocturia.  He is working on weight loss, thus far has lost about 15 to 20 pounds since going to weight management.  His Past Medical History Is Significant For: Past Medical History:   Diagnosis Date   Acid reflux    Back pain    Chronic headaches    Edema of both lower extremities    Fatigue    GERD (gastroesophageal reflux disease)    High cholesterol    IBS (irritable bowel syndrome)    Joint pain    Pre-diabetes    SOB (shortness of breath)    Swallowing difficulty    Vitamin D deficiency    Vitamin D deficiency     His Past Surgical History Is Significant For: Past Surgical History:  Procedure Laterality Date   ADENOIDECTOMY      His Family History Is Significant For: Family History  Problem Relation Age of Onset   Allergic rhinitis Mother    Cancer Mother    Obesity Mother    Allergic rhinitis Father    Cancer Father    Obesity Father    Diabetes Father    Asthma Sister    Allergic rhinitis Sister    Eczema Neg Hx    Immunodeficiency Neg Hx    Urticaria Neg Hx    Atopy Neg Hx     His Social History Is Significant For: Social History   Socioeconomic History   Marital status: Married    Spouse name: [name]   Number of children: Not on file   Years of education: Not on file   Highest education level: Not on file  Occupational History   Occupation: Materials engineer  Tobacco Use   Smoking status: Never   Smokeless tobacco: Never  Vaping Use   Vaping Use: Never used  Substance and Sexual Activity   Alcohol use: No   Drug use: No   Sexual activity: Not on file  Other Topics Concern   Not on file  Social History Narrative   Caffiene:  1-2 energy drinks daily.    Education: AD   Work: Database administrator).   Social Determinants of Health   Financial Resource Strain: Not on file  Food Insecurity: Not on file  Transportation Needs: Not on file  Physical Activity: Not on file  Stress: Not on file  Social Connections: Not on file    His Allergies Are:  No Known Allergies:   His Current Medications Are:  Outpatient Encounter Medications as of 09/29/2022  Medication Sig   albuterol (VENTOLIN HFA) 108 (90 Base) MCG/ACT  inhaler Inhale 2 puffs into the lungs every 6 (six) hours as needed for wheezing or shortness of breath.   azelastine (ASTELIN) 0.1 % nasal spray Place 2 sprays into both nostrils 2 (two) times daily as needed for rhinitis. Use in each nostril as directed   EPINEPHrine 0.3 mg/0.3 mL IJ SOAJ injection Inject into the muscle.   fexofenadine (ALLEGRA) 180 MG tablet Take 180 mg by mouth daily.   fluticasone (FLONASE) 50 MCG/ACT nasal spray SPRAY 1 SPRAY INTO EACH NOSTRIL EVERY DAY   ipratropium (ATROVENT) 0.03 % nasal spray Place 2 sprays into both nostrils 3 (three) times daily.   metFORMIN (GLUCOPHAGE) 500 MG tablet 1 tab with brkfast and 1 tab po with lunch daily   montelukast (SINGULAIR) 10 MG tablet TAKE 1 TABLET BY MOUTH EVERYDAY AT BEDTIME   Multiple Vitamin (MULTI-VITAMIN) tablet Take 1 tablet by mouth daily.   omeprazole (PRILOSEC) 40 MG capsule Take 40 mg by mouth daily.   Probiotic Product (PROBIOTIC-10 PO) Take by mouth daily at 12 noon.   Spacer/Aero-Holding Chambers (OPTICHAMBER DIAMOND-LG MASK) DEVI USE AS DIRECTED AS NEEDED   Vitamin D, Ergocalciferol, (DRISDOL) 1.25 MG (50000 UNIT) CAPS capsule Take 1 capsule (50,000 Units total) by mouth every 7 (seven) days.   [DISCONTINUED] PAXLOVID, 300/100, 20 x 150 MG & 10 x 100MG  TBPK TAKE 1 TABLET BY MOUTH TWICE A DAY FOR 5 DAYS   No facility-administered encounter medications on file as of 09/29/2022.  :   Review of Systems:  Out of a complete 14 point review of systems, all are reviewed and negative with the exception of these symptoms as listed below:  Review of Systems  Neurological:        Daytime sleepiness.  Snoring.  Morning headaches. Has fallen asleep playing cards. Father as OSA. See's weight management.   ESS 21,  FSS 48.    Objective:  Neurological Exam  Physical Exam Physical Examination:   Vitals:   09/29/22 1011  BP: 135/82  Pulse: 70    General Examination: The patient is a very pleasant 36 y.o. male in no  acute distress. He appears well-developed and well-nourished and well groomed.   HEENT: Normocephalic, atraumatic, pupils are equal, round and reactive to light, extraocular tracking is good without limitation to gaze excursion or nystagmus noted. Hearing is grossly intact. Face is symmetric with normal facial animation. Speech is clear with no dysarthria noted. There is no hypophonia. There is no lip, neck/head, jaw or voice tremor. Neck is supple with full range of passive  and active motion. There are no carotid bruits on auscultation. Oropharynx exam reveals: mild mouth dryness, good dental hygiene and moderate airway crowding, due to small airway, tonsillar size of about 1+, smaller uvula, Mallampati class II, neck circumference 17 three-quarter inches.  Minimal overbite.  Tongue protrudes centrally and palate elevates symmetrically.  Chest: Clear to auscultation without wheezing, rhonchi or crackles noted.  Heart: S1+S2+0, regular and normal without murmurs, rubs or gallops noted.   Abdomen: Soft, non-tender and non-distended.  Extremities: There is no pitting edema in the distal lower extremities bilaterally.   Skin: Warm and dry without trophic changes noted.   Musculoskeletal: exam reveals no obvious joint deformities.   Neurologically:  Mental status: The patient is awake, alert and oriented in all 4 spheres. His immediate and remote memory, attention, language skills and fund of knowledge are appropriate. There is no evidence of aphasia, agnosia, apraxia or anomia. Speech is clear with normal prosody and enunciation. Thought process is linear. Mood is normal and affect is normal.  Cranial nerves II - XII are as described above under HEENT exam.  Motor exam: Normal bulk, strength and tone is noted. There is no obvious action or resting tremor.  Fine motor skills and coordination: grossly intact.  Cerebellar testing: No dysmetria or intention tremor. There is no truncal or gait ataxia.   Sensory exam: intact to light touch in the upper and lower extremities.  Gait, station and balance: He stands easily. No veering to one side is noted. No leaning to one side is noted. Posture is age-appropriate and stance is narrow based. Gait shows normal stride length and normal pace. No problems turning are noted.   Assessment and Plan:  In summary, Mace Weinberg is a very pleasant 36 y.o.-year old male with an underlying medical history of prediabetes, vitamin D deficiency, reflux disease, recurrent headaches, hyperlipidemia, irritable bowel syndrome, back pain, and obesity, whose history and physical exam are concerning for sleep disordered breathing, supporting a current working diagnosis of unspecified sleep apnea, with the main differential diagnoses of obstructive sleep apnea (OSA) versus upper airway resistance syndrome (UARS) versus central sleep apnea (CSA), or mixed sleep apnea. A laboratory attended sleep study is considered gold standard for evaluation of sleep disordered breathing and is recommended at this time and clinically justified.   I had a long chat with the patient about my findings and the diagnosis of sleep apnea, particularly OSA, its prognosis and treatment options. We talked about medical/conservative treatments, surgical interventions and non-pharmacological approaches for symptom control. I explained, in particular, the risks and ramifications of untreated moderate to severe OSA, especially with respect to developing cardiovascular disease down the road, including congestive heart failure (CHF), difficult to treat hypertension, cardiac arrhythmias (particularly A-fib), neurovascular complications including TIA, stroke and dementia. Even type 2 diabetes has, in part, been linked to untreated OSA. Symptoms of untreated OSA may include (but may not be limited to) daytime sleepiness, nocturia (i.e. frequent nighttime urination), memory problems, mood irritability and suboptimally  controlled or worsening mood disorder such as depression and/or anxiety, lack of energy, lack of motivation, physical discomfort, as well as recurrent headaches, especially morning or nocturnal headaches. We talked the importance of maintaining a healthy lifestyle and striving for healthy weight.  In addition, we talked about the importance of striving for and maintaining good sleep hygiene.  He is strongly advised not to drive when feeling sleepy. I recommended the following at this time: sleep study.  I outlined the differences between  a laboratory attended sleep study which is considered more comprehensive and accurate over the option of a home sleep test (HST); the latter may lead to underestimation of sleep disordered breathing in some instances and does not help with diagnosing upper airway resistance syndrome and is not accurate enough to diagnose primary central sleep apnea typically. I explained the different sleep test procedures to the patient in detail and also outlined possible surgical and non-surgical treatment options of OSA, including the use of a pressure airway pressure (PAP) device (ie CPAP, AutoPAP/APAP or BiPAP in certain circumstances), a custom-made dental device (aka oral appliance, which would require a referral to a specialist dentist or orthodontist typically, and is generally speaking not considered a good choice for patients with full dentures or edentulous state), upper airway surgical options, such as traditional UPPP (which is not considered a first-line treatment) or the Inspire device (hypoglossal nerve stimulator, which would involve a referral for consultation with an ENT surgeon, after careful selection, following inclusion criteria). I explained the PAP treatment option to the patient in detail, as this is generally considered first-line treatment.  The patient indicated that he would be willing to try PAP therapy, if the need arises. I explained the importance of being  compliant with PAP treatment, not only for insurance purposes but primarily to improve patient's symptoms symptoms, and for the patient's long term health benefit, including to reduce His cardiovascular risks longer-term.    We will pick up our discussion about the next steps and treatment options after testing.  We will keep him posted as to the test results by phone call and/or MyChart messaging where possible.  We will plan to follow-up in sleep clinic accordingly as well.  I answered all his questions today and the patient was in agreement.   I encouraged him to call with any interim questions, concerns, problems or updates or email [place] through MyChart.  Generally speaking, sleep test authorizations may take up to 2 weeks, sometimes less, sometimes longer, the patient is encouraged to get in touch with [place] if they do not hear back from the sleep lab staff directly within the next 2 weeks.  Thank you very much for allowing me to participate in the care of this nice patient. If I can be of any further assistance to you please do not hesitate to call me at [phone].  Sincerely,   Huston Foley, MD, PhD

## 2022-10-07 ENCOUNTER — Ambulatory Visit (INDEPENDENT_AMBULATORY_CARE_PROVIDER_SITE_OTHER): Payer: 59 | Admitting: Family Medicine

## 2022-10-31 ENCOUNTER — Other Ambulatory Visit (INDEPENDENT_AMBULATORY_CARE_PROVIDER_SITE_OTHER): Payer: Self-pay | Admitting: Family Medicine

## 2022-10-31 DIAGNOSIS — R7303 Prediabetes: Secondary | ICD-10-CM

## 2022-11-09 ENCOUNTER — Telehealth: Payer: Self-pay | Admitting: Neurology

## 2022-11-09 NOTE — Telephone Encounter (Signed)
HST- [employer] no auth req.  Patient is scheduled at [facility] for 11/30/22 at 3 pm.  Mailed packet to the patient.

## 2022-11-20 ENCOUNTER — Other Ambulatory Visit: Payer: Self-pay | Admitting: Internal Medicine

## 2022-11-30 ENCOUNTER — Ambulatory Visit (INDEPENDENT_AMBULATORY_CARE_PROVIDER_SITE_OTHER): Payer: BC Managed Care – PPO | Admitting: Neurology

## 2022-11-30 DIAGNOSIS — R519 Headache, unspecified: Secondary | ICD-10-CM

## 2022-11-30 DIAGNOSIS — G4733 Obstructive sleep apnea (adult) (pediatric): Secondary | ICD-10-CM | POA: Diagnosis not present

## 2022-11-30 DIAGNOSIS — R400 Somnolence: Secondary | ICD-10-CM

## 2022-11-30 DIAGNOSIS — G4730 Sleep apnea, unspecified: Secondary | ICD-10-CM

## 2022-11-30 DIAGNOSIS — Z820 Family history of epilepsy and other diseases of the nervous system: Secondary | ICD-10-CM

## 2022-11-30 DIAGNOSIS — Z9189 Other specified personal risk factors, not elsewhere classified: Secondary | ICD-10-CM

## 2022-11-30 DIAGNOSIS — R0683 Snoring: Secondary | ICD-10-CM

## 2022-11-30 DIAGNOSIS — E669 Obesity, unspecified: Secondary | ICD-10-CM

## 2022-11-30 DIAGNOSIS — G4719 Other hypersomnia: Secondary | ICD-10-CM

## 2022-12-02 ENCOUNTER — Other Ambulatory Visit (INDEPENDENT_AMBULATORY_CARE_PROVIDER_SITE_OTHER): Payer: Self-pay | Admitting: Family Medicine

## 2022-12-02 DIAGNOSIS — E559 Vitamin D deficiency, unspecified: Secondary | ICD-10-CM

## 2022-12-02 NOTE — Progress Notes (Signed)
See procedure note.

## 2022-12-03 NOTE — Addendum Note (Signed)
Addended by: Huston Foley on: 12/03/2022 10:49 AM   Modules accepted: Orders

## 2022-12-03 NOTE — Procedures (Signed)
   [facility]  HOME SLEEP TEST (Watch PAT) REPORT  STUDY DATE: 11/30/22  DOB: 1986/08/23  MRN: [id number]  ORDERING CLINICIAN: Huston Foley, MD, PhD   REFERRING CLINICIAN: Dr. Sharee Holster  CLINICAL INFORMATION/HISTORY: 36 year old right-handed gentleman with an underlying medical history of prediabetes, vitamin D deficiency, reflux disease, recurrent headaches, hyperlipidemia, irritable bowel syndrome, back pain, and obesity, who reports snoring and excessive daytime somnolence.   Epworth sleepiness score: 21/24.  BMI: 35.2 kg/m  FINDINGS:   Sleep Summary:   Total Recording Time (hours, min): 7 hours, 21 min  Total Sleep Time (hours, min):  6 hours, 23 min  Percent REM (%):    36.6%   Respiratory Indices:   Calculated pAHI (per hour):  7/hour         REM pAHI:    15.4/hour       NREM pAHI: 2/hour  Central pAHI: 0/hour  Oxygen Saturation Statistics:    Oxygen Saturation (%) Mean: 94%   Minimum oxygen saturation (%):                 89%   O2 Saturation Range (%): 89 - 98%    O2 Saturation (minutes) <=88%: 0 min  Pulse Rate Statistics:   Pulse Mean (bpm):    62/min    Pulse Range (48 - 100/min)   IMPRESSION: OSA (obstructive sleep apnea), mild   RECOMMENDATION:  This home sleep test demonstrates overall mild obstructive sleep apnea with a total AHI of 7/hour and O2 nadir of 89%. Snoring was detected, and appeared to be intermittent, mild to moderate. Given the patient's medical history and sleep related complaints, therapy with a  positive airway pressure device is a reasonable first-line choice and clinically recommended. Treatment can be achieved in the form of autoPAP trial/titration at home for now. A full night, in-lab PAP titration study may aid in improving proper treatment settings and with mask fit, if needed, down the road. Alternative treatments may include weight loss (where appropriate) along with avoidance of the supine sleep  position (if possible), or an oral appliance in appropriate candidates.   Please note that untreated obstructive sleep apnea may carry additional perioperative morbidity. Patients with significant obstructive sleep apnea should receive perioperative PAP therapy and the surgeons and particularly the anesthesiologist should be informed of the diagnosis and the severity of the sleep disordered breathing. The patient should be cautioned not to drive, work at heights, or operate dangerous or heavy equipment when tired or sleepy. Review and reiteration of good sleep hygiene measures should be pursued with any patient. Other causes of the patient's symptoms, including circadian rhythm disturbances, an underlying mood disorder, medication effect and/or an underlying medical problem cannot be ruled out based on this test. Clinical correlation is recommended.  The patient and his referring provider will be notified of the test results. The patient will be seen in follow up in sleep clinic at [facility], as necessary.  I certify that I have reviewed the raw data recording prior to the issuance of this report in accordance with the standards of the American Academy of Sleep Medicine (AASM).  INTERPRETING PHYSICIAN:   Huston Foley, MD, PhD Medical Director, [facility]) Diplomat, ABPN (Neurology and Sleep)   [facility] [address], [address] [place], [place] [address] [phone]

## 2022-12-07 ENCOUNTER — Encounter: Payer: Self-pay | Admitting: Neurology

## 2022-12-07 ENCOUNTER — Telehealth: Payer: Self-pay | Admitting: *Deleted

## 2022-12-07 NOTE — Telephone Encounter (Signed)
Please call patient and schedule initial autopap f/u appt approx 80 days from today's date

## 2022-12-07 NOTE — Telephone Encounter (Signed)
Pt was scheduled for his initial Autopap f/u on (02/22/23) Pt was informed to bring machine and power cord to the appointment.   DME in pt's Snapshot between dates : Phone rep was advised to schedule within approx 80 days

## 2022-12-07 NOTE — Telephone Encounter (Signed)
I spoke with the patient and discussed his sleep study results.  The patient is amenable to proceeding with AutoPap for treatment.  He understands some insurances are very particular and may not cover treatment for rather mild sleep apnea.  I provided him with the alternative treatment options for him to keep in mind.  Patient requested to use [employer] for his DME company.  He states he has new insurance through [employer] but we do not have that card on file yet.  He will provide that to [place] today through a MyChart message.  The patient understands insurance compliance requirements which includes using the machine at least 4 hours at night and also being seen in our office for an initial follow-up appointment between 30 and 90 days after set up.  Patient is aware to expect a phone call from our office to schedule that follow-up appointment and also a phone call from [employer] to discuss the next step since starting treatment.  His questions were answered and he verbalized appreciation for the call.  Autopap referral faxed to [employer]. Received a receipt of confirmation. Sleep study result sent to referring provider, Dr Sharee Holster.

## 2022-12-07 NOTE — Telephone Encounter (Signed)
-----   Message from Huston Foley, MD sent at 12/03/2022 10:49 AM EST ----- Patient referred by Dr. Sharee Holster, seen by me on 09/29/22, HST on 11/30/22.    Please call and notify the patient that the recent home sleep test showed obstructive sleep apnea. OSA is overall mild, but worth treating to see if he feels better after treatment. To that end I recommend treatment for this in the form of autoPAP, which means, that we don't have to bring him in for a sleep study with CPAP, but will let him try an autoPAP machine at home, through a DME company (of his choice, or as per insurance requirement). The DME representative will educate him on how to use the machine, how to put the mask on, etc. I have placed an order in the chart, please he wants to start on treatment.  Alternative treatment options may include ongoing weight loss, avoiding sleeping on the back and a dental device potentially through dentistry.  If he would rather consider a dental device, we can facilitate with a referral to dentistry. In case of starting AutoPap therapy, we will need to follow-up in sleep clinic within 1 to 3 months of set up date.  Please encourage compliance so he fulfills insurance mandated criteria.  The DME company will file for insurance coverage, certain insurances do not cover treatment for rather mild sleep apnea, we do not always know until insurance is requested to authorize treatment. Huston Foley, MD, PhD [facility])

## 2022-12-09 DIAGNOSIS — G4733 Obstructive sleep apnea (adult) (pediatric): Secondary | ICD-10-CM | POA: Diagnosis not present

## 2022-12-09 DIAGNOSIS — E669 Obesity, unspecified: Secondary | ICD-10-CM | POA: Diagnosis not present

## 2022-12-09 DIAGNOSIS — G4730 Sleep apnea, unspecified: Secondary | ICD-10-CM | POA: Diagnosis not present

## 2022-12-09 DIAGNOSIS — G4719 Other hypersomnia: Secondary | ICD-10-CM | POA: Diagnosis not present

## 2023-01-04 ENCOUNTER — Encounter: Payer: Self-pay | Admitting: Family Medicine

## 2023-01-05 ENCOUNTER — Encounter: Payer: Self-pay | Admitting: Family Medicine

## 2023-01-05 ENCOUNTER — Ambulatory Visit: Payer: BC Managed Care – PPO | Admitting: Family Medicine

## 2023-01-05 VITALS — BP 122/86 | HR 66 | Temp 98.5°F | Ht 74.75 in | Wt 271.2 lb

## 2023-01-05 DIAGNOSIS — E559 Vitamin D deficiency, unspecified: Secondary | ICD-10-CM | POA: Diagnosis not present

## 2023-01-05 DIAGNOSIS — K2100 Gastro-esophageal reflux disease with esophagitis, without bleeding: Secondary | ICD-10-CM | POA: Diagnosis not present

## 2023-01-05 DIAGNOSIS — E6609 Other obesity due to excess calories: Secondary | ICD-10-CM

## 2023-01-05 DIAGNOSIS — Z23 Encounter for immunization: Secondary | ICD-10-CM | POA: Diagnosis not present

## 2023-01-05 DIAGNOSIS — Z6834 Body mass index (BMI) 34.0-34.9, adult: Secondary | ICD-10-CM

## 2023-01-05 DIAGNOSIS — Z1159 Encounter for screening for other viral diseases: Secondary | ICD-10-CM

## 2023-01-05 MED ORDER — PANTOPRAZOLE SODIUM 40 MG PO TBEC: 40.0000 mg | DELAYED_RELEASE_TABLET | Freq: Every day | ORAL | 3 refills | Status: DC

## 2023-01-05 MED ORDER — VITAMIN D (ERGOCALCIFEROL) 1.25 MG (50000 UNIT) PO CAPS: 50000.0000 [IU] | ORAL_CAPSULE | ORAL | 0 refills | Status: DC

## 2023-01-05 NOTE — Progress Notes (Signed)
[facility] [address] [place] [address] Dept: [phone] Dept Fax: [phone]  New Patient Office Visit  Subjective:    Patient ID: Corey Pittman, male    DOB: 05-Dec-1986, 37 y.o..   MRN: [id number]  Chief Complaint  Patient presents with   New Patient (Initial Visit)    No concerns. Fasting.    History of Present Illness:  Patient is in today to establish care. Mr. Tanzi was born in [place], [place]. 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 Mr. Mapel has been married Landscape architect) for 7 years. They have a child (6) that they have permanent custody of and a foster infant (10 mo). He denies use of tobacco, alcohol, or drugs.  Mr. Creekmore has a history of seasonal and perennial allergies. He is currently undergoing immunotherapy. His allergies are managed with fexofenadine 180 mg daily, montelukast 10 mg daily and Flonase spray. He also uses azelastine and ipratropium nasal sprays as needed.  Mr. Bones has a history of obesity. He had been going to [facility]. He was found to have vitamin D insufficiency. He is currently on Vitamin D replacement. 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 However, as he had GI side effects, he is no longer using this.  Mr. Bobrowski has a history of GERD. He had been managed on omeprazole. He notes his insurance is no longer covering this, but would cover pantoprazole.  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   Seasonal and perennial allergic rhinitis 11/16/2021   Allergic conjunctivitis of both eyes 11/16/2021   Esophageal  dysphagia 12/22/2017   Gastroesophageal reflux disease with esophagitis 12/22/2017   Mixed hyperlipidemia 12/22/2017   Class 1 obesity due to excess calories without serious comorbidity in adult 12/09/2016   Past Surgical History:  Procedure Laterality Date   ADENOIDECTOMY     Family History  Problem Relation Age of Onset   Allergic rhinitis Mother    Cancer Mother    Obesity Mother    Allergic rhinitis Father    Cancer Father        Prostate   Obesity Father    Diabetes Father    Asthma Sister    Allergic rhinitis Sister    Cancer Maternal Uncle        Lung   Diabetes Maternal Grandmother    Heart disease Maternal Grandfather    Diabetes Maternal Grandfather    Diabetes Paternal Grandmother    Cancer Paternal Grandfather        Prostate   Eczema Neg Hx    Immunodeficiency Neg Hx    Urticaria Neg Hx    Atopy Neg Hx    Outpatient Medications Prior to Visit  Medication Sig Dispense Refill   albuterol (VENTOLIN HFA) 108 (90 Base) MCG/ACT inhaler Inhale 2 puffs into the lungs every 6 (six) hours as needed for wheezing or shortness of breath. 8 g 2   azelastine (ASTELIN) 0.1 % nasal spray Place 2 sprays into both nostrils 2 (two) times daily as needed for rhinitis. Use in each nostril as directed 30 mL 6   EPINEPHrine 0.3 mg/0.3 mL IJ SOAJ injection  Inject into the muscle.     fexofenadine (ALLEGRA) 180 MG tablet Take 180 mg by mouth daily.     fluticasone (FLONASE) 50 MCG/ACT nasal spray SPRAY 1 SPRAY INTO EACH NOSTRIL EVERY DAY     ipratropium (ATROVENT) 0.03 % nasal spray Place 2 sprays into both nostrils 3 (three) times daily. 30 mL 5   montelukast (SINGULAIR) 10 MG tablet TAKE 1 TABLET BY MOUTH EVERYDAY AT BEDTIME 90 tablet 0   Multiple Vitamin (MULTI-VITAMIN) tablet Take 1 tablet by mouth daily.     Probiotic Product (PROBIOTIC-10 PO) Take by mouth daily at 12 noon.     Spacer/Aero-Holding Chambers (OPTICHAMBER DIAMOND-LG MASK) DEVI USE AS DIRECTED AS NEEDED 1 each 1    Vitamin D, Ergocalciferol, (DRISDOL) 1.25 MG (50000 UNIT) CAPS capsule Take 1 capsule (50,000 Units total) by mouth every 7 (seven) days. 4 capsule 0   omeprazole (PRILOSEC) 40 MG capsule Take 40 mg by mouth daily. (Patient not taking: Reported on 01/05/2023)     metFORMIN (GLUCOPHAGE) 500 MG tablet 1 tab with brkfast and 1 tab po with lunch daily (Patient not taking: Reported on 01/05/2023) 60 tablet 0   No facility-administered medications prior to visit.   No Known Allergies    Objective:   Today's Vitals   01/05/23 0857  BP: 122/86  Pulse: 66  Temp: 98.5 F (36.9 C)  TempSrc: Oral  SpO2: 97%  Weight: 271 lb 3.2 oz (123 kg)  Height: 6' 2.75" (1.899 m)   Body mass index is 34.12 kg/m.   General: Well developed, well nourished. No acute distress. Psych: Alert and oriented. Normal mood and affect.  Health Maintenance Due  Topic Date Due   HIV Screening  Never done   Hepatitis C Screening  Never done   DTaP/Tdap/Td (1 - Tdap) Never done   INFLUENZA VACCINE  07/27/2022     Assessment & Plan:   1. Class 1 obesity due to excess calories without serious comorbidity with body mass index (BMI) of 34.0 to 34.9 in adult Maximum weight: 289 lbs (07/27/2022) Current weight: 271 lbs Total weight loss: 18 lbs (2.8%)  Urge his continued focus on diet and exercise.  2. Gastroesophageal reflux disease with esophagitis without hemorrhage I will switch his PPI to pantoprazole.  - pantoprazole (PROTONIX) 40 MG tablet; Take 1 tablet (40 mg total) by mouth daily.  Dispense: 30 tablet; Refill: 3  3. Vitamin D insufficiency I will reassess his Vit. D level today. He can continue Vit. D replacement in the interim.  - VITAMIN D 25 Hydroxy (Vit-D Deficiency, Fractures) - Vitamin D, Ergocalciferol, (DRISDOL) 1.25 MG (50000 UNIT) CAPS capsule; Take 1 capsule (50,000 Units total) by mouth every 7 (seven) days.  Dispense: 4 capsule; Refill: 0  4. Encounter for hepatitis C screening test for low  risk patient  - HCV Ab w Reflex to Quant PCR  5. Need for diphtheria-tetanus-pertussis (Tdap) vaccine  - Tdap vaccine greater than or equal to 7yo IM  6. Need for influenza vaccination  - Flu Vaccine QUAD 6+ mos PF IM (Fluarix Quad PF)  Return in about 1 year (around 01/06/2024) for Annual preventative care.   Haydee Salter, MD

## 2023-01-09 DIAGNOSIS — G4719 Other hypersomnia: Secondary | ICD-10-CM | POA: Diagnosis not present

## 2023-01-09 DIAGNOSIS — G4733 Obstructive sleep apnea (adult) (pediatric): Secondary | ICD-10-CM | POA: Diagnosis not present

## 2023-01-09 DIAGNOSIS — E669 Obesity, unspecified: Secondary | ICD-10-CM | POA: Diagnosis not present

## 2023-01-09 DIAGNOSIS — G4730 Sleep apnea, unspecified: Secondary | ICD-10-CM | POA: Diagnosis not present

## 2023-01-24 ENCOUNTER — Telehealth: Payer: Self-pay | Admitting: Neurology

## 2023-01-24 NOTE — Telephone Encounter (Signed)
LVM and sent mychart msg informing pt of appointment change due to MD being out 

## 2023-01-25 ENCOUNTER — Other Ambulatory Visit: Payer: Self-pay | Admitting: Family Medicine

## 2023-01-25 DIAGNOSIS — E559 Vitamin D deficiency, unspecified: Secondary | ICD-10-CM

## 2023-02-07 ENCOUNTER — Telehealth: Payer: Self-pay | Admitting: Internal Medicine

## 2023-02-07 DIAGNOSIS — R051 Acute cough: Secondary | ICD-10-CM | POA: Diagnosis not present

## 2023-02-07 DIAGNOSIS — J069 Acute upper respiratory infection, unspecified: Secondary | ICD-10-CM | POA: Diagnosis not present

## 2023-02-07 MED ORDER — PAXLOVID (300/100) 20 X 150 MG & 10 X 100MG PO TBPK: ORAL_TABLET | ORAL | 0 refills | Status: DC

## 2023-02-07 NOTE — Telephone Encounter (Signed)
Received phone call from patient's wife that he is having fever, chills and malaise. Wife recovering from [place]. Son with similar symptoms. Initial rapid negative.   However, given known household contact and his PMHx, decision to treat with paxlovid which was sent to his pharmacy and in accordance with FDA guidelines.   Patient to quarantine for 5 days, mask when out for additional 5.

## 2023-02-08 ENCOUNTER — Encounter: Payer: Self-pay | Admitting: Family Medicine

## 2023-02-08 ENCOUNTER — Ambulatory Visit: Payer: BC Managed Care – PPO | Admitting: Family Medicine

## 2023-02-08 VITALS — BP 134/76 | HR 94 | Temp 98.3°F | Ht 74.75 in | Wt 269.0 lb

## 2023-02-08 DIAGNOSIS — J020 Streptococcal pharyngitis: Secondary | ICD-10-CM

## 2023-02-08 LAB — POCT RAPID STREP A (OFFICE): Rapid Strep A Screen: POSITIVE — AB

## 2023-02-08 MED ORDER — PENICILLIN V POTASSIUM 500 MG PO TABS: 500.0000 mg | ORAL_TABLET | Freq: Three times a day (TID) | ORAL | 0 refills | Status: AC

## 2023-02-08 NOTE — Patient Instructions (Signed)

## 2023-02-08 NOTE — Progress Notes (Signed)
[facility] [address] [place] [address] Dept: [phone] Dept Fax: [phone]  Office Visit  Subjective:    Patient ID: Corey Pittman, male    DOB: 04-29-1986, 37 y.o..   MRN: [id number]  Chief Complaint  Patient presents with   Fever    C/o having fever, ST, body aches, chills x 3 days.  He has taken Tylenol and Ibuprofen.   Went to [facility] in [place] 02/07/23 and 02/08/23 (neg covid and flu)   History of Present Illness:  Patient is in today for complaining of ongoing issues with fever/chills, sore throat, and body aches. He notes he feels like he has swelling of the glands in his upper neck to where breathing while flat feels more difficult. He has been propping up in bed.  He was seen yesterday at [facility] and tested negative for influenza and COVID. He returned today to [facility], as his throat pain felt worse. He had a rapid strep test which was again neg. He has been taking some Tylenol and Motrin for throat pain. He has also been using warm liquids and honey. He came to see me as he feels something more is going on than a viral illness.  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   Seasonal and perennial allergic rhinitis 11/16/2021   Allergic conjunctivitis of both eyes 11/16/2021   Esophageal dysphagia 12/22/2017   Gastroesophageal reflux disease with esophagitis 12/22/2017   Mixed hyperlipidemia 12/22/2017   Class 1 obesity due to excess calories without serious comorbidity in adult 12/09/2016   Past Surgical History:  Procedure Laterality Date   ADENOIDECTOMY     Family History  Problem Relation Age of Onset   Allergic rhinitis Mother    Cancer Mother    Obesity Mother    Allergic rhinitis Father    Cancer Father        Prostate   Obesity Father    Diabetes Father     Asthma Sister    Allergic rhinitis Sister    Cancer Maternal Uncle        Lung   Diabetes Maternal Grandmother    Heart disease Maternal Grandfather    Diabetes Maternal Grandfather    Diabetes Paternal Grandmother    Cancer Paternal Grandfather        Prostate   Eczema Neg Hx    Immunodeficiency Neg Hx    Urticaria Neg Hx    Atopy Neg Hx    Outpatient Medications Prior to Visit  Medication Sig Dispense Refill   albuterol (VENTOLIN HFA) 108 (90 Base) MCG/ACT inhaler Inhale 2 puffs into the lungs every 6 (six) hours as needed for wheezing or shortness of breath. 8 g 2   azelastine (ASTELIN) 0.1 % nasal spray Place 2 sprays into both nostrils 2 (two) times daily as needed for rhinitis. Use in each nostril as directed 30 mL 6   EPINEPHrine 0.3 mg/0.3 mL IJ SOAJ injection Inject into the muscle.     fexofenadine (ALLEGRA) 180 MG tablet Take 180 mg by mouth daily.     fluticasone (FLONASE) 50 MCG/ACT nasal spray SPRAY 1 SPRAY INTO EACH NOSTRIL EVERY DAY     ipratropium (ATROVENT) 0.03 % nasal spray Place 2 sprays into both nostrils 3 (three) times daily. 30 mL 5   montelukast (SINGULAIR) 10 MG tablet TAKE 1 TABLET BY  MOUTH EVERYDAY AT BEDTIME 90 tablet 0   Multiple Vitamin (MULTI-VITAMIN) tablet Take 1 tablet by mouth daily.     pantoprazole (PROTONIX) 40 MG tablet Take 1 tablet (40 mg total) by mouth daily. 30 tablet 3   Probiotic Product (PROBIOTIC-10 PO) Take by mouth daily at 12 noon.     Spacer/Aero-Holding Chambers (OPTICHAMBER DIAMOND-LG MASK) DEVI USE AS DIRECTED AS NEEDED 1 each 1   Vitamin D, Ergocalciferol, (DRISDOL) 1.25 MG (50000 UNIT) CAPS capsule TAKE 1 CAPSULE (50,000 UNITS TOTAL) BY MOUTH EVERY 7 (SEVEN) DAYS. NEEDS REFILL 12 capsule 1   nirmatrelvir & ritonavir (PAXLOVID, 300/100,) 20 x 150 MG & 10 x 100MG TBPK Nirmatrelvir 300 mg (two 150-mg tablets) with ritonavir 100 mg (one 100-mg tablet); administer all three tablets together orally twice daily with or without food  for 5 days 30 each 0   No facility-administered medications prior to visit.   No Known Allergies   Objective:   Today's Vitals   02/08/23 1337  BP: 134/76  Pulse: 94  Temp: 98.3 F (36.8 C)  TempSrc: Temporal  SpO2: 98%  Weight: 269 lb (122 kg)  Height: 6' 2.75" (1.899 m)   Body mass index is 33.85 kg/m.   General: Well developed, well nourished. No acute distress. HEENT: Normocephalic, non-traumatic. Conjunctiva clear. External ears normal. EAC and TMs normal bilaterally.   Nose clear without significant congestion or rhinorrhea. Mucous membranes moist. There are white exudates on   both tonsils. Oropharynx clear. Good dentition. Neck: Supple. No lymphadenopathy. No thyromegaly. Lungs: Clear to auscultation bilaterally. No wheezing, rales or rhonchi. Psych: Alert and oriented. Normal mood and affect.  Health Maintenance Due  Topic Date Due   HIV Screening  Never done   Hepatitis C Screening  Never done   Modified Centor Score                        Fever (>100.4 F)   0 Tonsillar exudate or swelling  1 Absence of cough   1 Swollen, tender anterior cervical    nodes    1 Age 9-44 years   0 Total     3  Score <2, no further testing Score 2-3, perform Rapid Strep test Score >4, treat empirically  Lab Results Rapid Strep: Positive    Assessment & Plan:   Problem List Items Addressed This Visit   None Visit Diagnoses     Strep throat    -  Primary   It appears that MediQ failed to get an adequate sample from the tonsils. I will treat him with a 10-day course of Pen VK. Continue symptomatic treatments.   Relevant Medications   penicillin v potassium (VEETID) 500 MG tablet   Other Relevant Orders   POCT rapid strep A (Completed)      Return if symptoms worsen or fail to improve.   Haydee Salter, MD

## 2023-02-09 DIAGNOSIS — G4730 Sleep apnea, unspecified: Secondary | ICD-10-CM | POA: Diagnosis not present

## 2023-02-09 DIAGNOSIS — G4719 Other hypersomnia: Secondary | ICD-10-CM | POA: Diagnosis not present

## 2023-02-09 DIAGNOSIS — G4733 Obstructive sleep apnea (adult) (pediatric): Secondary | ICD-10-CM | POA: Diagnosis not present

## 2023-02-09 DIAGNOSIS — E669 Obesity, unspecified: Secondary | ICD-10-CM | POA: Diagnosis not present

## 2023-02-22 ENCOUNTER — Ambulatory Visit: Payer: BC Managed Care – PPO | Admitting: Neurology

## 2023-02-23 ENCOUNTER — Other Ambulatory Visit: Payer: Self-pay | Admitting: Internal Medicine

## 2023-03-01 ENCOUNTER — Ambulatory Visit: Payer: BC Managed Care – PPO | Admitting: Neurology

## 2023-03-01 ENCOUNTER — Encounter: Payer: Self-pay | Admitting: Neurology

## 2023-03-01 VITALS — BP 124/65 | HR 66 | Ht 75.0 in | Wt 263.8 lb

## 2023-03-01 DIAGNOSIS — G4733 Obstructive sleep apnea (adult) (pediatric): Secondary | ICD-10-CM | POA: Diagnosis not present

## 2023-03-01 NOTE — Patient Instructions (Signed)
It was nice to see you again today. I am glad to hear, things are going well with your autoPAP therapy. You have adjusted well to treatment with your new machine, and you are compliant with it. You have also fulfilled the insurance-mandated compliance percentage, which is reassuring, so you can get ongoing supplies through your insurance. Please talk to your DME provider about getting replacement supplies on a regular basis. Please be sure to change your filter every month, your mask about every 3 months, hose about every 6 months, humidifier chamber about yearly. Some restrictions are imposed by your insurance carrier with regard to how frequently you can get certain supplies.  Your DME company can provide further details if necessary.   Please continue using your autoPAP regularly. While your insurance requires that you use PAP at least 4 hours each night on 70% of the nights, I recommend, that you not skip any nights and use it throughout the night if you can. Getting used to PAP and staying with the treatment long term does take time and patience and discipline. Untreated obstructive sleep apnea when it is moderate to severe can have an adverse impact on cardiovascular health and raise her risk for heart disease, arrhythmias, hypertension, congestive heart failure, stroke and diabetes. Untreated obstructive sleep apnea causes sleep disruption, nonrestorative sleep, and sleep deprivation. This can have an impact on your day to day functioning and cause daytime sleepiness and impairment of cognitive function, memory loss, mood disturbance, and problems focussing. Using PAP regularly can improve these symptoms.  We can see you in 1 year, you can see one of our nurse practitioners as you are stable.

## 2023-03-01 NOTE — Progress Notes (Signed)
Subjective:    Patient ID: Corey Pittman is a 37 y.o. male.  HPI    Interim history:   Corey Pittman is a 37 year old right-handed gentleman with an underlying medical history of prediabetes, vitamin D deficiency, reflux disease, recurrent headaches, hyperlipidemia, irritable bowel syndrome, back pain, and obesity, who presents for follow-up consultation of his obstructive sleep apnea after interim testing and starting home AutoPap therapy.  The patient is unaccompanied today.  I first met him at the request of his weight management physician on 09/29/2022, at which time he reported snoring and excessive daytime somnolence.  He was advised to proceed with a sleep study.  He had a home sleep test on 11/30/2022 which demonstrated overall mild obstructive sleep apnea with an AHI of 7/h, O2 nadir 89% with snoring detected intermittently in the mild to moderate range.  He was offered home AutoPap therapy.  He agreed to treatment.  His set up date was 12/09/2022.  He has a ResMed air sense 11 AutoSet machine.  DME company is [employer] forces [employer].  Today, 03/01/2023: I reviewed his AutoPap compliance data from 01/29/2023 through 02/27/2023, which is a total of 30 days, during which time he used his machine 29 days with percent use days greater than 4 hours at 83%, indicating very good compliance with an average usage of 5 hours and 40 minutes, residual AHI at goal at 0.9/h, 95th percentile of pressure at 10 cm with a range of 6 to 12 cm with EPR of 2.  Leak acceptable with the 95th percentile at 9.8 L/min.  He reports doing well, after initial adjustment he feels that he is benefiting from treatment and is quite pleased.  His Epworth sleepiness score is 9 out of 24, previously 21 out of 24.  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  He also works at [employer].  He does admit not getting a whole lot of sleep, bedtime is generally between 12 and 1 and rise time around 6.  He does not have  much in the way of mouth dryness, he tries to hydrate well.  He continues to work on weight loss but has not been able to go to weight management clinic at [facility] because of insurance change and high co-pays. He uses a medium F30i FFM, started with a nasal mask, but he is a mouth breather.   The patient's allergies, current medications, family history, past medical history, past social history, past surgical history and problem list were reviewed and updated as appropriate.   Previously:   09/29/22: (He) reports snoring and excessive daytime somnolence.  I reviewed your office note from 08/24/2022.  His Epworth sleepiness score is 21 out of 24, fatigue severity score is 48 out of 63.  He has come close to falling asleep at the wheel, never fell asleep while driving.  His bedtime is around midnight and rise time around 5:30 AM or 6 AM.  He lives with his family which includes his wife and 21-year-old son as well as 33-monthold son.  The little 1 has a crib in their bedroom but sleeps fairly well through the night.  They have a small dog in the household, the dog tends to sleep on the bed with them.  They do not have a TV in the bedroom.  He works for [employer]  His father has sleep apnea.  He is familiar with the diagnosis as his wife also has sleep apnea.  He has woken up with a headache occasionally and has to take ibuprofen up to 800 mg, typically only once in the morning.  He is a non-smoker and does not drink any alcohol, he does drink caffeine in the form of energy drinks, 1 or 2/day on average.  He denies any night to night nocturia.  He is working on weight loss, thus far has lost about 15 to 20 pounds since going to weight management.    His Past Medical History Is Significant For: Past Medical History:  Diagnosis Date   Acid reflux    Back pain    Chronic headaches    Edema of both lower extremities    Fatigue    GERD (gastroesophageal reflux disease)    High cholesterol    IBS  (irritable bowel syndrome)    Joint pain    Pre-diabetes    SOB (shortness of breath)    Swallowing difficulty    Vitamin D deficiency    Vitamin D deficiency     His Past Surgical History Is Significant For: Past Surgical History:  Procedure Laterality Date   ADENOIDECTOMY      His Family History Is Significant For: Family History  Problem Relation Age of Onset   Allergic rhinitis Mother    Cancer Mother    Obesity Mother    Allergic rhinitis Father    Cancer Father        Prostate   Obesity Father    Diabetes Father    Asthma Sister    Allergic rhinitis Sister    Cancer Maternal Uncle        Lung   Diabetes Maternal Grandmother    Heart disease Maternal Grandfather    Diabetes Maternal Grandfather    Diabetes Paternal Grandmother    Cancer Paternal Grandfather        Prostate   Eczema Neg Hx    Immunodeficiency Neg Hx    Urticaria Neg Hx    Atopy Neg Hx     His Social History Is Significant For: Social History   Socioeconomic History   Marital status: Married    Spouse name: Barista   Number of children: Not on file   Years of education: Not on file   Highest education level: Some college, no degree  Occupational History   Occupation: Press photographer    Comment: Warehouse manager (adjustable bed frames)   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    Education: AD   Social Determinants of Health   Financial Resource Strain: Not on file  Food Insecurity: Not on file  Transportation Needs: Not on file  Physical Activity: Not on file  Stress: Not on file  Social Connections: Not on file    His Allergies Are:  No Known Allergies:   His Current Medications Are:   Outpatient Encounter Medications as of 03/01/2023  Medication Sig   albuterol (VENTOLIN HFA) 108 (90 Base) MCG/ACT inhaler Inhale 2 puffs into the lungs every 6 (six) hours as needed for wheezing or shortness of breath.   azelastine (ASTELIN) 0.1 % nasal spray Place 2 sprays into both  nostrils 2 (two) times daily as needed for rhinitis. Use in each nostril as directed   EPINEPHrine 0.3 mg/0.3 mL IJ SOAJ injection Inject into the muscle.   fexofenadine (ALLEGRA) 180 MG tablet Take 180 mg by mouth daily.   fluticasone (FLONASE) 50 MCG/ACT nasal spray SPRAY 1 SPRAY INTO EACH NOSTRIL EVERY DAY   ipratropium (ATROVENT) 0.03 % nasal spray Place 2 sprays into both nostrils 3 (three) times daily.   montelukast (SINGULAIR) 10 MG tablet TAKE 1 TABLET BY MOUTH EVERYDAY AT BEDTIME   Multiple Vitamin (MULTI-VITAMIN) tablet Take 1 tablet by mouth daily.   pantoprazole (PROTONIX) 40 MG tablet Take 1 tablet (40 mg total) by mouth daily.   Probiotic Product (PROBIOTIC-10 PO) Take by mouth daily at 12 noon.   Spacer/Aero-Holding Chambers (OPTICHAMBER DIAMOND-LG MASK) DEVI USE AS DIRECTED AS NEEDED   Vitamin D, Ergocalciferol, (DRISDOL) 1.25 MG (50000 UNIT) CAPS capsule TAKE 1 CAPSULE (50,000 UNITS TOTAL) BY MOUTH EVERY 7 (SEVEN) DAYS. NEEDS REFILL   No facility-administered encounter medications on file as of 03/01/2023.  :  Review of Systems:  Out of a complete 14 point review of systems, all are reviewed and negative with the exception of these symptoms as listed below:  Review of Systems  Neurological:        Cpap follow up ESS 9.   Set up date 12-09-42.Marland Kitchen  Feels rested when used.      Objective:  Neurological Exam  Physical Exam Physical Examination:   Vitals:   03/01/23 0744  BP: 124/65  Pulse: 66    General Examination: The patient is a very pleasant 37 y.o. male in no acute distress. He appears well-developed and well-nourished and well groomed.   HEENT: Normocephalic, atraumatic, pupils  are equal, round and reactive to light, extraocular tracking is good without limitation to gaze excursion or nystagmus noted. Hearing is grossly intact. Face is symmetric with normal facial animation. Speech is clear with no dysarthria noted. There is no hypophonia. There is no lip, neck/head, jaw or voice tremor. Neck with FROM. There are no carotid bruits on auscultation. Oropharynx exam reveals: mild mouth dryness, good dental hygiene and moderate airway crowding. Tongue protrudes centrally and palate elevates symmetrically.   Chest: Clear to auscultation without wheezing, rhonchi or crackles noted.   Heart: S1+S2+0, regular and normal without murmurs, rubs or gallops noted.    Abdomen: Soft, non-tender and non-distended.   Extremities: There is no pitting edema in the distal lower extremities bilaterally.    Skin: Warm and dry without trophic changes noted.    Musculoskeletal: exam reveals no obvious joint deformities.    Neurologically:  Mental status: The patient is awake, alert and oriented in all 4 spheres. His immediate and remote memory, attention, language skills and fund of knowledge are appropriate. There is no evidence of aphasia, agnosia, apraxia or anomia. Speech is clear with normal prosody and enunciation. Thought process is linear. Mood is normal and affect is normal.  Cranial nerves II - XII are as described above under HEENT exam.  Motor exam: Normal bulk, strength and tone is noted. There is no obvious action or resting tremor.  Fine motor skills and coordination: grossly intact.  Cerebellar testing: No dysmetria or intention tremor. There is no truncal or gait ataxia.  Sensory exam: intact to light touch in the upper and lower extremities.  Gait, station and balance: He stands easily. No veering to one side is noted. No leaning to one side is noted. Posture is  age-appropriate and stance is narrow based. Gait shows normal stride length and normal pace. No problems turning  are noted.    Assessment and Plan:  In summary, Finton Quincey is a very pleasant 37 year old male with an underlying medical history of prediabetes, vitamin D deficiency, reflux disease, recurrent headaches, hyperlipidemia, irritable bowel syndrome, back pain, and obesity, who presents for follow-up consultation of his obstructive sleep apnea after interim testing and starting home AutoPap therapy.  His home sleep test on 11/30/2022 showed an AHI of 7/h, O2 nadir 89% with snoring detected intermittently in the mild to moderate range.  He started home AutoPap therapy on 12/09/2022.  He has a ResMed air sense 11 AutoSet machine.  DME company is [employer].  He is compliant with treatment.  He is very pleased with how he is doing, sleepiness and daytime energy have improved.  He continues to work on weight loss.  It has been helpful for him and that his work demands physical activity.  He is encouraged to make more time for sleep if possible.  He does work 2 jobs.  He is commended for his treatment adherence.  We talked about his home sleep test results and reviewed his compliance data in detail today.  He is advised to follow-up routinely to see one of our nurse practitioners in 1 year in sleep clinic.  If he achieves a desired amount of weight loss, we can always consider retesting him with a home sleep test in the future.  I answered all his questions today and he was in agreement with our plan. I spent 30 minutes in total face-to-face time and in reviewing records during pre-charting, more than 50% of which was spent in counseling and coordination of care, reviewing test results, reviewing medications and treatment regimen and/or in discussing or reviewing the diagnosis of OSA, the prognosis and treatment options. Pertinent laboratory and imaging test results that were available during this visit with the patient were reviewed by me and considered in my medical decision making (see chart for details).

## 2023-03-10 DIAGNOSIS — G4719 Other hypersomnia: Secondary | ICD-10-CM | POA: Diagnosis not present

## 2023-03-10 DIAGNOSIS — G4730 Sleep apnea, unspecified: Secondary | ICD-10-CM | POA: Diagnosis not present

## 2023-03-10 DIAGNOSIS — G4733 Obstructive sleep apnea (adult) (pediatric): Secondary | ICD-10-CM | POA: Diagnosis not present

## 2023-03-10 DIAGNOSIS — E669 Obesity, unspecified: Secondary | ICD-10-CM | POA: Diagnosis not present

## 2023-04-10 DIAGNOSIS — E669 Obesity, unspecified: Secondary | ICD-10-CM | POA: Diagnosis not present

## 2023-04-10 DIAGNOSIS — G4733 Obstructive sleep apnea (adult) (pediatric): Secondary | ICD-10-CM | POA: Diagnosis not present

## 2023-04-10 DIAGNOSIS — G4730 Sleep apnea, unspecified: Secondary | ICD-10-CM | POA: Diagnosis not present

## 2023-04-10 DIAGNOSIS — G4719 Other hypersomnia: Secondary | ICD-10-CM | POA: Diagnosis not present

## 2023-05-04 ENCOUNTER — Other Ambulatory Visit: Payer: Self-pay | Admitting: Family Medicine

## 2023-05-04 DIAGNOSIS — K2100 Gastro-esophageal reflux disease with esophagitis, without bleeding: Secondary | ICD-10-CM

## 2023-05-10 DIAGNOSIS — G4730 Sleep apnea, unspecified: Secondary | ICD-10-CM | POA: Diagnosis not present

## 2023-05-10 DIAGNOSIS — E669 Obesity, unspecified: Secondary | ICD-10-CM | POA: Diagnosis not present

## 2023-05-10 DIAGNOSIS — G4719 Other hypersomnia: Secondary | ICD-10-CM | POA: Diagnosis not present

## 2023-05-10 DIAGNOSIS — G4733 Obstructive sleep apnea (adult) (pediatric): Secondary | ICD-10-CM | POA: Diagnosis not present

## 2023-07-17 ENCOUNTER — Other Ambulatory Visit: Payer: Self-pay | Admitting: Family Medicine

## 2023-07-17 DIAGNOSIS — E559 Vitamin D deficiency, unspecified: Secondary | ICD-10-CM

## 2023-07-20 DIAGNOSIS — G4733 Obstructive sleep apnea (adult) (pediatric): Secondary | ICD-10-CM | POA: Diagnosis not present

## 2023-08-16 ENCOUNTER — Other Ambulatory Visit (HOSPITAL_COMMUNITY): Payer: Self-pay

## 2023-08-26 DIAGNOSIS — Z0279 Encounter for issue of other medical certificate: Secondary | ICD-10-CM

## 2023-09-01 ENCOUNTER — Other Ambulatory Visit (HOSPITAL_COMMUNITY): Payer: Self-pay

## 2023-09-09 ENCOUNTER — Ambulatory Visit: Payer: BC Managed Care – PPO | Admitting: Family Medicine

## 2023-09-09 ENCOUNTER — Encounter: Payer: Self-pay | Admitting: Family Medicine

## 2023-09-09 VITALS — BP 124/70 | HR 86 | Temp 98.0°F | Ht 75.0 in | Wt 271.4 lb

## 2023-09-09 DIAGNOSIS — J069 Acute upper respiratory infection, unspecified: Secondary | ICD-10-CM | POA: Diagnosis not present

## 2023-09-09 LAB — POCT INFLUENZA A/B
Influenza A, POC: NEGATIVE
Influenza B, POC: NEGATIVE

## 2023-09-09 LAB — POCT RAPID STREP A (OFFICE): Rapid Strep A Screen: NEGATIVE

## 2023-09-09 LAB — POC COVID19 BINAXNOW: SARS Coronavirus 2 Ag: NEGATIVE

## 2023-09-09 NOTE — Assessment & Plan Note (Signed)
Discussed home care for viral illness, including rest, pushing fluids, and OTC medications as needed for symptom relief. Recommend hot tea with honey for sore throat symptoms. I remain suspicious for COVID and recommend he repeat a home COVID test tomorrow. Recommend he stay out of work over the weekend.  Follow-up if needed for worsening or persistent symptoms.

## 2023-09-09 NOTE — Progress Notes (Signed)
[facility] [address] [place] [address] Dept: [phone] Dept Fax: [phone]  Office Visit  Subjective:    Patient ID: Corey Pittman, male    DOB: 12/02/1986, 37 y.o..   MRN: [id number]  Chief Complaint  Patient presents with   Fever    C/o having body aches, fever (102), ST, HA  x 2 days. He has taken Ibuprofen and Tylenol.      History of Present Illness:  Patient is in today for with a 2-day history of body aches, fever up to 103 F, sore throat and headache. He notes his son was recently diagnosed with HFMD. He has not notes any sores in his mouth or lesions on his hands or feet. He had a strep infection last winter. He has been using ibuprofen and Tylenol.  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   Seasonal and perennial allergic rhinitis 11/16/2021   Allergic conjunctivitis of both eyes 11/16/2021   Esophageal dysphagia 12/22/2017   Gastroesophageal reflux disease with esophagitis 12/22/2017   Mixed hyperlipidemia 12/22/2017   Class 1 obesity due to excess calories without serious comorbidity in adult 12/09/2016   Past Surgical History:  Procedure Laterality Date   ADENOIDECTOMY     Family History  Problem Relation Age of Onset   Allergic rhinitis Mother    Cancer Mother    Obesity Mother    Allergic rhinitis Father    Cancer Father        Prostate   Obesity Father    Diabetes Father    Asthma Sister    Allergic rhinitis Sister    Cancer Maternal Uncle        Lung   Diabetes Maternal Grandmother    Heart disease Maternal Grandfather    Diabetes Maternal Grandfather    Diabetes Paternal Grandmother    Cancer Paternal Grandfather        Prostate   Eczema Neg Hx    Immunodeficiency Neg Hx    Urticaria Neg Hx    Atopy Neg Hx    Outpatient  Medications Prior to Visit  Medication Sig Dispense Refill   albuterol (VENTOLIN HFA) 108 (90 Base) MCG/ACT inhaler Inhale 2 puffs into the lungs every 6 (six) hours as needed for wheezing or shortness of breath. 8 g 2   azelastine (ASTELIN) 0.1 % nasal spray Place 2 sprays into both nostrils 2 (two) times daily as needed for rhinitis. Use in each nostril as directed 30 mL 6   EPINEPHrine 0.3 mg/0.3 mL IJ SOAJ injection Inject into the muscle.     fexofenadine (ALLEGRA) 180 MG tablet Take 180 mg by mouth daily.     fluticasone (FLONASE) 50 MCG/ACT nasal spray SPRAY 1 SPRAY INTO EACH NOSTRIL EVERY DAY     ipratropium (ATROVENT) 0.03 % nasal spray Place 2 sprays into both nostrils 3 (three) times daily. 30 mL 5   montelukast (SINGULAIR) 10 MG tablet TAKE 1 TABLET BY MOUTH EVERYDAY AT BEDTIME 90 tablet 1   Multiple Vitamin (MULTI-VITAMIN) tablet Take 1 tablet by mouth daily.     pantoprazole (PROTONIX) 40 MG tablet TAKE 1 TABLET BY MOUTH EVERY DAY 90 tablet 1   Probiotic Product (PROBIOTIC-10 PO) Take by mouth daily at 12 noon.     Spacer/Aero-Holding Chambers (OPTICHAMBER DIAMOND-LG MASK) DEVI USE AS DIRECTED AS NEEDED 1 each  1   Vitamin D, Ergocalciferol, (DRISDOL) 1.25 MG (50000 UNIT) CAPS capsule TAKE 1 CAPSULE (50,000 UNITS TOTAL) BY MOUTH EVERY 7 (SEVEN) DAYS. NEEDS REFILL 12 capsule 1   No facility-administered medications prior to visit.   No Known Allergies   Objective:   Today's Vitals   09/09/23 1551  BP: 124/70  Pulse: 86  Temp: 98 F (36.7 C)  TempSrc: Temporal  SpO2: 95%  Weight: 271 lb 6.4 oz (123.1 kg)  Height: 6\' 3"  (1.905 m)   Body mass index is 33.92 kg/m.   General: Well developed, well nourished. No acute distress. HEENT: Normocephalic, non-traumatic. PERRL, EOMI. Conjunctiva clear. External ears normal. EAC and TMs normal   bilaterally. Nose clear without congestion or rhinorrhea. Mucous membranes moist. Mild redness to the posterior   oropharynx. No tonsillar  exudates. Good dentition. Neck: Supple. Mildly tender over the submandibular glands. No lymphadenopathy.  Lungs: Clear to auscultation bilaterally. No wheezing, rales or rhonchi. Psych: Alert and oriented. Normal mood and affect.  Health Maintenance Due  Topic Date Due   HIV Screening  Never done   Hepatitis C Screening  Never done   Lab Results POCT Covid: Neg. POCT Influenza A& B: Neg. POCT Rapid Strep: Neg.    Assessment & Plan:   Problem List Items Addressed This Visit       Respiratory   Viral URI - Primary    Discussed home care for viral illness, including rest, pushing fluids, and OTC medications as needed for symptom relief. Recommend hot tea with honey for sore throat symptoms. I remain suspicious for COVID and recommend he repeat a home COVID test tomorrow. Recommend he stay out of work over the weekend.  Follow-up if needed for worsening or persistent symptoms.       Relevant Orders   POC COVID-19 (Completed)   POCT Influenza A/B (Completed)   POCT rapid strep A (Completed)    Return if symptoms worsen or fail to improve.   Loyola Mast, MD

## 2024-01-04 ENCOUNTER — Other Ambulatory Visit: Payer: Self-pay

## 2024-01-04 ENCOUNTER — Telehealth: Payer: Self-pay

## 2024-01-04 MED ORDER — PAXLOVID (300/100) 20 X 150 MG & 10 X 100MG PO TBPK: 3.0000 | ORAL_TABLET | Freq: Two times a day (BID) | ORAL | 0 refills | Status: DC

## 2024-01-04 NOTE — Addendum Note (Signed)
 Addended by: Berna Bue on: 01/04/2024 09:54 AM   Modules accepted: Orders

## 2024-01-04 NOTE — Addendum Note (Signed)
 Addended by: Berna Bue on: 01/04/2024 09:52 AM   Modules accepted: Orders

## 2024-01-04 NOTE — Telephone Encounter (Signed)
 Pt contacted office he has tested positive to covid on a at home test. His wife is also positive as of Sunday. Pt was wondering if you could send in paxlovid for him to [facility]

## 2024-01-04 NOTE — Telephone Encounter (Signed)
 Sent in paxlovid to [facility] pt informed

## 2024-01-09 ENCOUNTER — Ambulatory Visit (INDEPENDENT_AMBULATORY_CARE_PROVIDER_SITE_OTHER): Payer: BC Managed Care – PPO | Admitting: Family Medicine

## 2024-01-09 ENCOUNTER — Encounter: Payer: Self-pay | Admitting: Family Medicine

## 2024-01-09 VITALS — BP 124/72 | HR 77 | Temp 98.5°F | Ht 75.0 in | Wt 276.4 lb

## 2024-01-09 DIAGNOSIS — R7303 Prediabetes: Secondary | ICD-10-CM | POA: Diagnosis not present

## 2024-01-09 DIAGNOSIS — Z0000 Encounter for general adult medical examination without abnormal findings: Secondary | ICD-10-CM

## 2024-01-09 DIAGNOSIS — E782 Mixed hyperlipidemia: Secondary | ICD-10-CM

## 2024-01-09 DIAGNOSIS — K2100 Gastro-esophageal reflux disease with esophagitis, without bleeding: Secondary | ICD-10-CM | POA: Diagnosis not present

## 2024-01-09 DIAGNOSIS — Z1159 Encounter for screening for other viral diseases: Secondary | ICD-10-CM

## 2024-01-09 LAB — HEMOGLOBIN A1C: Hgb A1c MFr Bld: 5.8 % (ref 4.6–6.5)

## 2024-01-09 LAB — LIPID PANEL
Cholesterol: 170 mg/dL (ref 0–200)
HDL: 31.9 mg/dL — ABNORMAL LOW (ref >39.00)
LDL Cholesterol: 101 mg/dL — ABNORMAL HIGH (ref 0–99)
NonHDL: 138.11
Total CHOL/HDL Ratio: 5
Triglycerides: 185 mg/dL — ABNORMAL HIGH (ref 0.0–149.0)
VLDL: 37 mg/dL (ref 0.0–40.0)

## 2024-01-09 LAB — BASIC METABOLIC PANEL
BUN: 14 mg/dL (ref 6–23)
CO2: 28 meq/L (ref 19–32)
Calcium: 9.3 mg/dL (ref 8.4–10.5)
Chloride: 103 meq/L (ref 96–112)
Creatinine, Ser: 0.83 mg/dL (ref 0.40–1.50)
GFR: 113.52 mL/min (ref >60.00)
Glucose, Bld: 97 mg/dL (ref 70–99)
Potassium: 4.2 meq/L (ref 3.5–5.1)
Sodium: 138 meq/L (ref 135–145)

## 2024-01-09 MED ORDER — PANTOPRAZOLE SODIUM 40 MG PO TBEC: 40.0000 mg | DELAYED_RELEASE_TABLET | Freq: Every day | ORAL | 3 refills | Status: DC

## 2024-01-09 NOTE — Progress Notes (Signed)
 [facility] [address] [place] [address] Dept: [phone] Dept Fax: [phone]  Annual Physical Visit  Subjective:    Patient ID: Corey Pittman, male    DOB: 1986-08-18, 38 y.o..   MRN: [id number]  Chief Complaint  Patient presents with   Annual Exam    CPE/labs. No concerns.  Fasting today.    History of Present Illness:  Patient is in today for an annual physical/preventative visit.  Mr. Mehra has a history of GERD. He finds this is well controlled when on his medications. He does note certain food triggers, but feels he is managing with this.  Review of Systems  Constitutional:  Negative for chills, diaphoresis, fever, malaise/fatigue and weight loss.  HENT:  Negative for congestion, ear pain, hearing loss, sinus pain, sore throat and tinnitus.   Eyes:  Negative for blurred vision, pain, discharge and redness.  Respiratory:  Negative for cough, shortness of breath and wheezing.   Cardiovascular:  Negative for chest pain and palpitations.  Gastrointestinal:  Positive for heartburn. Negative for abdominal pain, constipation, diarrhea, nausea and vomiting.       As noted above  Musculoskeletal:  Negative for back pain, joint pain and myalgias.  Skin:  Negative for itching and rash.  Psychiatric/Behavioral:  Negative for depression. The patient is not nervous/anxious.    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   Seasonal and perennial allergic rhinitis 11/16/2021   Allergic conjunctivitis of both eyes 11/16/2021   Esophageal dysphagia 12/22/2017   Gastroesophageal reflux disease with esophagitis 12/22/2017   Mixed hyperlipidemia 12/22/2017   Class 1 obesity due to excess calories without serious comorbidity in adult  12/09/2016   Past Surgical History:  Procedure Laterality Date   ADENOIDECTOMY     Family History  Problem Relation Age of Onset   Allergic rhinitis Mother    Cancer Mother    Obesity Mother    Allergic rhinitis Father    Cancer Father        Prostate   Obesity Father    Diabetes Father    Asthma Sister    Allergic rhinitis Sister    Cancer Maternal Uncle        Lung   Diabetes Maternal Grandmother    Heart disease Maternal Grandfather    Diabetes Maternal Grandfather    Diabetes Paternal Grandmother    Cancer Paternal Grandfather        Prostate   Eczema Neg Hx    Immunodeficiency Neg Hx    Urticaria Neg Hx    Atopy Neg Hx    Outpatient Medications Prior to Visit  Medication Sig Dispense Refill   albuterol  (VENTOLIN  HFA) 108 (90 Base) MCG/ACT inhaler Inhale 2 puffs into the lungs every 6 (six) hours as needed for wheezing or shortness of breath. 8 g 2   azelastine  (ASTELIN ) 0.1 % nasal spray Place 2 sprays into both nostrils 2 (two) times daily as needed for rhinitis. Use in each nostril as directed 30 mL 6   EPINEPHrine  0.3 mg/0.3 mL IJ SOAJ injection Inject into the muscle.     fexofenadine  (ALLEGRA ) 180 MG tablet Take 180 mg by mouth daily.     fluticasone  (FLONASE ) 50 MCG/ACT nasal spray SPRAY 1 SPRAY INTO EACH NOSTRIL EVERY DAY  ipratropium (ATROVENT ) 0.03 % nasal spray Place 2 sprays into both nostrils 3 (three) times daily. 30 mL 5   Multiple Vitamin (MULTI-VITAMIN) tablet Take 1 tablet by mouth daily.     Probiotic Product (PROBIOTIC-10 PO) Take by mouth daily at 12 noon.     Spacer/Aero-Holding Chambers (OPTICHAMBER DIAMOND-LG MASK) DEVI USE AS DIRECTED AS NEEDED 1 each 1   pantoprazole  (PROTONIX ) 40 MG tablet TAKE 1 TABLET BY MOUTH EVERY DAY 90 tablet 1   montelukast  (SINGULAIR ) 10 MG tablet TAKE 1 TABLET BY MOUTH EVERYDAY AT BEDTIME 90 tablet 1   nirmatrelvir/ritonavir (PAXLOVID , 300/100,) 20 x 150 MG & 10 x 100MG  TBPK Take 3 tablets by mouth 2 (two) times  daily for 5 days. Patient GFR is 117 take 3 tablets twice daily for 5 days 30 tablet 0   Vitamin D , Ergocalciferol , (DRISDOL ) 1.25 MG (50000 UNIT) CAPS capsule TAKE 1 CAPSULE (50,000 UNITS TOTAL) BY MOUTH EVERY 7 (SEVEN) DAYS. NEEDS REFILL 12 capsule 1   No facility-administered medications prior to visit.   No Known Allergies Objective:   Today's Vitals   01/09/24 0807  BP: 124/72  Pulse: 77  Temp: 98.5 F (36.9 C)  TempSrc: Temporal  SpO2: 96%  Weight: 276 lb 6.4 oz (125.4 kg)  Height: 6' 3 (1.905 m)   Body mass index is 34.55 kg/m.   General: Well developed, well nourished. No acute distress. HEENT: Normocephalic, non-traumatic. PERRL, EOMI. Conjunctiva clear. External ears normal. EAC and   TMs normal bilaterally. Nose clear without congestion or rhinorrhea. Mucous membranes moist. Oropharynx   clear. Good dentition. Neck: Supple. No lymphadenopathy. No thyromegaly. Lungs: Clear to auscultation bilaterally. No wheezing, rales or rhonchi. CV: RRR without murmurs or rubs. Pulses 2+ bilaterally. Abdomen: Soft, non-tender. Bowel sounds positive, normal pitch and frequency. No hepatosplenomegaly. No   rebound or guarding. Back: Straight. No CVA tenderness bilaterally. Extremities: Full ROM. No joint swelling or tenderness. No edema noted. Skin: Warm and dry. No rashes. Psych: Alert and oriented. Normal mood and affect.  Health Maintenance Due  Topic Date Due   HIV Screening  Never done   Hepatitis C Screening  Never done     Assessment & Plan:   Problem List Items Addressed This Visit       Digestive   Gastroesophageal reflux disease with esophagitis   Stable. Continue pantoprazole  40 mg daily.      Relevant Medications   pantoprazole  (PROTONIX ) 40 MG tablet     Other   Mixed hyperlipidemia   I will check lipids today.      Relevant Orders   Lipid panel   Prediabetes   I will recheck a blood sugar and A1c today. Encourage regular exercise.       Relevant Orders   Hemoglobin A1c   Basic metabolic panel   Other Visit Diagnoses       Annual physical exam    -  Primary   Overall good health. Encourage moderate exercise for 30 min. 5 days per week. Reviewed indicated screenigns and UTD on immunizations.     Encounter for hepatitis C screening test for low risk patient       Relevant Orders   HCV Ab w Reflex to Quant PCR       Return in about 1 year (around 01/08/2025) for Annual preventative care.   [name], MD

## 2024-01-09 NOTE — Assessment & Plan Note (Signed)
 I will check lipids today.

## 2024-01-09 NOTE — Assessment & Plan Note (Signed)
 I will recheck a blood sugar and A1c today. Encourage regular exercise.

## 2024-01-09 NOTE — Assessment & Plan Note (Signed)
 Stable. Continue pantoprazole 40mg  daily

## 2024-01-10 LAB — HCV AB W REFLEX TO QUANT PCR: HCV Ab: NONREACTIVE

## 2024-01-10 LAB — HCV INTERPRETATION

## 2024-01-15 ENCOUNTER — Telehealth: Payer: Self-pay | Admitting: Allergy & Immunology

## 2024-01-15 MED ORDER — ALBUTEROL SULFATE HFA 108 (90 BASE) MCG/ACT IN AERS: 2.0000 | INHALATION_SPRAY | Freq: Four times a day (QID) | RESPIRATORY_TRACT | 2 refills | Status: DC | PRN

## 2024-01-15 MED ORDER — PREDNISONE 10 MG PO TABS
ORAL_TABLET | ORAL | 0 refills | Status: DC
Start: 2024-01-15 — End: 2024-02-29

## 2024-01-15 NOTE — Telephone Encounter (Signed)
Patient called reporting that he recently had COVID in the last couple of weeks - contracted from his wife. He seemed to get better, but he is now having more mucous production and is using his rescue inhaler more. He is afebrile. He went to Urgent Care and just got some cough medicine. He is also trying some Mucinex without much help.  I sent in a prednisone taper and refilled his albuterol.   Malachi Bonds, MD [facility]

## 2024-02-25 ENCOUNTER — Encounter: Payer: Self-pay | Admitting: Allergy & Immunology

## 2024-02-25 MED ORDER — ONDANSETRON 8 MG PO TBDP: 8.0000 mg | ORAL_TABLET | Freq: Three times a day (TID) | ORAL | 0 refills | Status: AC | PRN

## 2024-02-27 ENCOUNTER — Encounter (HOSPITAL_BASED_OUTPATIENT_CLINIC_OR_DEPARTMENT_OTHER): Payer: Self-pay | Admitting: Emergency Medicine

## 2024-02-27 ENCOUNTER — Emergency Department (HOSPITAL_BASED_OUTPATIENT_CLINIC_OR_DEPARTMENT_OTHER)
Admission: EM | Admit: 2024-02-27 | Discharge: 2024-02-27 | Disposition: A | Discharge status: Home / Self Care | Attending: Emergency Medicine | Admitting: Emergency Medicine

## 2024-02-27 ENCOUNTER — Other Ambulatory Visit: Payer: Self-pay

## 2024-02-27 ENCOUNTER — Emergency Department (HOSPITAL_BASED_OUTPATIENT_CLINIC_OR_DEPARTMENT_OTHER)

## 2024-02-27 DIAGNOSIS — M545 Low back pain, unspecified: Secondary | ICD-10-CM | POA: Diagnosis not present

## 2024-02-27 DIAGNOSIS — E860 Dehydration: Secondary | ICD-10-CM | POA: Diagnosis not present

## 2024-02-27 DIAGNOSIS — R252 Cramp and spasm: Secondary | ICD-10-CM | POA: Diagnosis not present

## 2024-02-27 DIAGNOSIS — R1110 Vomiting, unspecified: Secondary | ICD-10-CM | POA: Diagnosis present

## 2024-02-27 LAB — COMPREHENSIVE METABOLIC PANEL
ALT: 22 U/L (ref 0–44)
AST: 19 U/L (ref 15–41)
Albumin: 4.1 g/dL (ref 3.5–5.0)
Alkaline Phosphatase: 57 U/L (ref 38–126)
Anion gap: 10 (ref 5–15)
BUN: 10 mg/dL (ref 6–20)
CO2: 24 mmol/L (ref 22–32)
Calcium: 8.9 mg/dL (ref 8.9–10.3)
Chloride: 106 mmol/L (ref 98–111)
Creatinine, Ser: 0.82 mg/dL (ref 0.61–1.24)
GFR, Estimated: >60 mL/min (ref >60)
Glucose, Bld: 76 mg/dL (ref 70–99)
Potassium: 3.8 mmol/L (ref 3.5–5.1)
Sodium: 140 mmol/L (ref 135–145)
Total Bilirubin: 0.6 mg/dL (ref 0.0–1.2)
Total Protein: 7.5 g/dL (ref 6.5–8.1)

## 2024-02-27 LAB — URINALYSIS, ROUTINE W REFLEX MICROSCOPIC
Bilirubin Urine: NEGATIVE
Glucose, UA: NEGATIVE mg/dL
Hgb urine dipstick: NEGATIVE
Ketones, ur: NEGATIVE mg/dL
Leukocytes,Ua: NEGATIVE
Nitrite: NEGATIVE
Protein, ur: NEGATIVE mg/dL
Specific Gravity, Urine: >=1.03 (ref 1.005–1.030)
pH: 5.5 (ref 5.0–8.0)

## 2024-02-27 LAB — CBC
HCT: 46.8 % (ref 39.0–52.0)
Hemoglobin: 16 g/dL (ref 13.0–17.0)
MCH: 29.5 pg (ref 26.0–34.0)
MCHC: 34.2 g/dL (ref 30.0–36.0)
MCV: 86.3 fL (ref 80.0–100.0)
Platelets: 252 10*3/uL (ref 150–400)
RBC: 5.42 MIL/uL (ref 4.22–5.81)
RDW: 12.9 % (ref 11.5–15.5)
WBC: 7.5 10*3/uL (ref 4.0–10.5)
nRBC: 0 % (ref 0.0–0.2)

## 2024-02-27 LAB — LIPASE, BLOOD: Lipase: 25 U/L (ref 11–51)

## 2024-02-27 LAB — PHOSPHORUS: Phosphorus: 3.8 mg/dL (ref 2.5–4.6)

## 2024-02-27 LAB — MAGNESIUM: Magnesium: 2 mg/dL (ref 1.7–2.4)

## 2024-02-27 MED ORDER — KETOROLAC TROMETHAMINE 30 MG/ML IJ SOLN
30.0000 mg | Freq: Once | INTRAMUSCULAR | Status: AC
  Administered 2024-02-27: 30 mg via INTRAVENOUS
  Filled 2024-02-27: qty 1

## 2024-02-27 MED ORDER — IBUPROFEN 800 MG PO TABS: 800.0000 mg | ORAL_TABLET | Freq: Three times a day (TID) | ORAL | 0 refills | Status: AC

## 2024-02-27 MED ORDER — LACTATED RINGERS IV BOLUS
1000.0000 mL | Freq: Once | INTRAVENOUS | Status: AC
  Administered 2024-02-27: 1000 mL via INTRAVENOUS

## 2024-02-27 MED ORDER — METHOCARBAMOL 750 MG PO TABS: 750.0000 mg | ORAL_TABLET | Freq: Three times a day (TID) | ORAL | 0 refills | Status: AC | PRN

## 2024-02-27 MED ORDER — METHOCARBAMOL 500 MG PO TABS
750.0000 mg | ORAL_TABLET | Freq: Once | ORAL | Status: AC
  Administered 2024-02-27: 750 mg via ORAL
  Filled 2024-02-27: qty 2

## 2024-02-27 NOTE — Discharge Instructions (Signed)
 1.  Take ibuprofen 800 mg every 8 hours for pain and aches.  You also may take extra strength Tylenol every 6 hours per package instructions.  Take Robaxin as prescribed for muscle spasms. 2.  Try to stay well-hydrated. 3.  See your doctor Wednesday as planned. 4.  Return to the emergency department if you have new worsening or concerning symptoms. 5.  Continue your antibiotics as previously prescribed.  Follow-up on the results of your chest x-ray with your physician on Wednesday and in your MyChart.

## 2024-02-27 NOTE — ED Provider Notes (Signed)
 [place] [facility] Provider Note   CSN: [id number] Arrival date & time: 02/27/24  1520     History  Chief Complaint  Patient presents with   Emesis    Corey Pittman is a 38 y.o. male.  HPI Patient reports that he had gotten diagnosed with a double bronchopneumonia at [facility].  He had been having some cough and fever at onset.  Patient was started on Augmentin.  He has been taking it for several days and then developed extensive vomiting and diarrhea.  He is unsure if it was due to the antibiotic or exposure to one of his children who also developed a vomiting and diarrheal illness.  After several days of vomiting and diarrhea, patient reports that the symptoms that is really bothering him [facility] emergency department is lower back pain.  He indicates pain in the low back symmetrically around the SI joints.  He reports that it aches and then radiates pain down the back of both legs.  He reports he just feels like it is cramping and he has a hard time getting comfortable.  Patient reports that the vomiting and diarrhea have subsist did.  He is not having any shortness of breath or cough at this time.  He reports that he continues to feel very fatigued and has had little energy for the past several days.  He has been trying Tylenol for pain control but not getting much relief.    Home Medications Prior to Admission medications   Medication Sig Start Date End Date Taking? Authorizing Provider  ibuprofen (ADVIL) 800 MG tablet Take 1 tablet (800 mg total) by mouth 3 (three) times daily. 02/27/24  Yes Arby Barrette, MD  methocarbamol (ROBAXIN-750) 750 MG tablet Take 1 tablet (750 mg total) by mouth every 8 (eight) hours as needed for muscle spasms. 02/27/24  Yes Arby Barrette, MD  albuterol (VENTOLIN HFA) 108 (90 Base) MCG/ACT inhaler Inhale 2 puffs into the lungs every 6 (six) hours as needed for wheezing or shortness of breath. 01/15/24   Alfonse Spruce, MD  azelastine (ASTELIN) 0.1 % nasal spray Place 2 sprays into both nostrils 2 (two) times daily as needed for rhinitis. Use in each nostril as directed 11/16/21   Verlee Monte, MD  EPINEPHrine 0.3 mg/0.3 mL IJ SOAJ injection Inject into the muscle. 11/23/21   [provider]  fexofenadine (ALLEGRA) 180 MG tablet Take 180 mg by mouth daily.    [provider]  fluticasone (FLONASE) 50 MCG/ACT nasal spray SPRAY 1 SPRAY INTO EACH NOSTRIL EVERY DAY 12/09/16   [provider]  ipratropium (ATROVENT) 0.03 % nasal spray Place 2 sprays into both nostrils 3 (three) times daily. 11/27/21   Verlee Monte, MD  Multiple Vitamin (MULTI-VITAMIN) tablet Take 1 tablet by mouth daily.    [provider]  ondansetron (ZOFRAN-ODT) 8 MG disintegrating tablet Take 1 tablet (8 mg total) by mouth every 8 (eight) hours as needed for nausea or vomiting. 02/25/24   Alfonse Spruce, MD  pantoprazole (PROTONIX) 40 MG tablet Take 1 tablet (40 mg total) by mouth daily. 01/09/24   Loyola Mast, MD  predniSONE (DELTASONE) 10 MG tablet Take 3 tabs (30mg ) twice daily for 3 days, then 2 tabs (20mg ) twice daily for 3 days, then 1 tab (10mg ) twice daily for 3 days, then STOP. 01/15/24   Alfonse Spruce, MD  Probiotic Product (PROBIOTIC-10 PO) Take by mouth daily at 12 noon.  [provider]  Spacer/Aero-Holding Chambers [facility] DIAMOND-LG MASK) DEVI USE AS DIRECTED AS NEEDED 11/27/21   Verlee Monte, MD      Allergies    Patient has no known allergies.    Review of Systems   Review of Systems  Physical Exam Updated Vital Signs BP (!) 134/90 (BP Location: Right Arm)   Pulse 72   Temp 97.9 F (36.6 C) (Oral)   Resp 20   Ht 6\' 3"  (1.905 m)   Wt 113.4 kg   SpO2 100%   BMI 31.25 kg/m  Physical Exam Constitutional:      Comments: Alert nontoxic.  No respiratory distress.  HENT:     Head: Normocephalic and atraumatic.     Mouth/Throat:     Pharynx:  Oropharynx is clear.  Eyes:     Extraocular Movements: Extraocular movements intact.  Cardiovascular:     Rate and Rhythm: Normal rate and regular rhythm.  Pulmonary:     Effort: Pulmonary effort is normal.     Breath sounds: Normal breath sounds.  Abdominal:     General: There is no distension.     Palpations: Abdomen is soft.     Tenderness: There is no abdominal tenderness. There is no guarding.  Musculoskeletal:        General: No swelling or tenderness. Normal range of motion.     Cervical back: Neck supple.     Right lower leg: No edema.     Left lower leg: No edema.     Comments: No CVA tenderness.  Patient indicates pain and stiffness starts at the level of the SI joint symmetrically.  No soft tissue changes.  No bony point tenderness.  Lower extremities are normal without any peripheral edema or calf tenderness.  Skin:    General: Skin is warm and dry.  Neurological:     General: No focal deficit present.     Mental Status: He is oriented to person, place, and time.     Motor: No weakness.     Coordination: Coordination normal.     ED Results / Procedures / Treatments   Labs (all labs ordered are listed, but only abnormal results are displayed) Labs Reviewed  LIPASE, BLOOD  COMPREHENSIVE METABOLIC PANEL  CBC  URINALYSIS, ROUTINE W REFLEX MICROSCOPIC  MAGNESIUM  PHOSPHORUS    EKG None  Radiology No results found.  Procedures Procedures    Medications Ordered in ED Medications  lactated ringers bolus 1,000 mL (1,000 mLs Intravenous New Bag/Given 02/27/24 2236)  ketorolac (TORADOL) 30 MG/ML injection 30 mg (30 mg Intravenous Given 02/27/24 2232)  methocarbamol (ROBAXIN) tablet 750 mg (750 mg Oral Given 02/27/24 2233)    ED Course/ Medical Decision Making/ A&P                                 Medical Decision Making Amount and/or Complexity of Data Reviewed Labs: ordered. Radiology: ordered.  Risk Prescription drug management.  Patient presents as  outlined.  He has had a vomiting and diarrheal illness for several days with fatigue and then followed by low back pain and achy muscles.  Patient is currently taking Augmentin which was prescribed for suspected pneumonia.  Clinically patient is nontoxic alert and well.  He has neurologic exam intact and normal.  Will proceed with lab work and x-ray and symptomatic treatment with rehydration Toradol and Robaxin.  Urinalysis negative.  Mag and Phos normal.  Metabolic panel normal.  CBC normal.  I have personally reviewed chest x-ray.  I do not appreciate any focal consolidations or large infiltrates.  Radiology review is pending.  23: 36 patient reports he feels much better.  He is comfortable.  No more aching or pain.  Patient feels much better after hydration and symptomatic treatment.  At this time I feel he is stable for discharge.  Labs are normal.  Chest x-ray review is pending however patient is currently being treated with Augmentin and does not have shortness of breath, hypoxia or any indication of a evolving pneumonia.  Patient has follow-up day after tomorrow.  We reviewed return precautions.  Stable to continue symptomatic home care.        Final Clinical Impression(s) / ED Diagnoses Final diagnoses:  Dehydration  Bilateral leg cramps    Rx / DC Orders ED Discharge Orders          Ordered    ibuprofen (ADVIL) 800 MG tablet  3 times daily        02/27/24 2335    methocarbamol (ROBAXIN-750) 750 MG tablet  Every 8 hours PRN        02/27/24 2335              Arby Barrette, MD 02/27/24 2339

## 2024-02-27 NOTE — ED Triage Notes (Signed)
 States dx with pneumonia  has been taking antiobiotics but his child had vomiting virus and now he is having N/V/D also feels bad he states

## 2024-02-28 ENCOUNTER — Telehealth: Payer: Self-pay

## 2024-02-28 NOTE — Transitions of Care (Post Inpatient/ED Visit) (Signed)
 02/28/2024  Name: Corey Pittman MRN: [id number] DOB: 31-May-1986  Today's TOC FU Call Status:   Patient's Name and Date of Birth confirmed.  Transition Care Management Follow-up Telephone Call Date of Discharge: 02/27/24 Discharge Facility: [facility] Type of Discharge: Emergency Department Reason for ED Visit: Other: How have you been since you were released from the hospital?: Same Any questions or concerns?: Yes Patient Questions/Concerns:: Leg pain is still present. Hasn't picked up muscle relaxer from pharmacy yet.  Items Reviewed: Did you receive and understand the discharge instructions provided?: Yes Medications obtained,verified, and reconciled?: Yes (Medications Reviewed) Any new allergies since your discharge?: No Dietary orders reviewed?: NA Do you have support at home?: Yes People in Home: spouse  Medications Reviewed Today: Medications Reviewed Today     Reviewed by Trudee Kuster, CMA (Certified Medical Assistant) on 02/28/24 at [phone]  Med List Status: <None>   Medication Order Taking? Sig Documenting Provider Last Dose Status Informant  albuterol (VENTOLIN HFA) 108 (90 Base) MCG/ACT inhaler [id number] Yes Inhale 2 puffs into the lungs every 6 (six) hours as needed for wheezing or shortness of breath. Alfonse Spruce, MD Taking Active   azelastine (ASTELIN) 0.1 % nasal spray [id number] Yes Place 2 sprays into both nostrils 2 (two) times daily as needed for rhinitis. Use in each nostril as directed Verlee Monte, MD Taking Active   EPINEPHrine 0.3 mg/0.3 mL IJ SOAJ injection [id number] Yes Inject into the muscle. [provider] Taking Active   fexofenadine (ALLEGRA) 180 MG tablet [id number] Yes Take 180 mg by mouth daily. [provider] Taking Active   fluticasone (FLONASE) 50 MCG/ACT nasal spray [id number] Yes SPRAY 1 SPRAY INTO EACH NOSTRIL EVERY DAY [provider] Taking Active   ibuprofen (ADVIL) 800 MG tablet [id number] Yes  Take 1 tablet (800 mg total) by mouth 3 (three) times daily. Arby Barrette, MD Taking Active   ipratropium (ATROVENT) 0.03 % nasal spray [id number] Yes Place 2 sprays into both nostrils 3 (three) times daily. Verlee Monte, MD Taking Active   methocarbamol (ROBAXIN-750) 750 MG tablet [id number] Yes Take 1 tablet (750 mg total) by mouth every 8 (eight) hours as needed for muscle spasms. Arby Barrette, MD Taking Active   Multiple Vitamin (MULTI-VITAMIN) tablet [id number] Yes Take 1 tablet by mouth daily. [provider] Taking Active   ondansetron (ZOFRAN-ODT) 8 MG disintegrating tablet [id number] Yes Take 1 tablet (8 mg total) by mouth every 8 (eight) hours as needed for nausea or vomiting. Alfonse Spruce, MD Taking Active   pantoprazole (PROTONIX) 40 MG tablet [id number] Yes Take 1 tablet (40 mg total) by mouth daily. Loyola Mast, MD Taking Active   predniSONE (DELTASONE) 10 MG tablet [id number] Yes Take 3 tabs (30mg ) twice daily for 3 days, then 2 tabs (20mg ) twice daily for 3 days, then 1 tab (10mg ) twice daily for 3 days, then STOP. Alfonse Spruce, MD Taking Active   Probiotic Product (PROBIOTIC-10 PO) [id number] Yes Take by mouth daily at 12 noon. [provider] Taking Active   Spacer/Aero-Holding [address] DIAMOND-LG [place]) [place] [id number] Yes USE AS DIRECTED AS NEEDED Verlee Monte, MD Taking Active             Home Care and Equipment/Supplies: Were Home Health Services Ordered?: NA Any new equipment or medical supplies ordered?: NA  Functional Questionnaire: Do you need assistance with bathing/showering or dressing?: No Do you need assistance with meal preparation?: No Do you need assistance  with eating?: No Do you have difficulty maintaining continence: No Do you need assistance with getting out of bed/getting out of a chair/moving?: No Do you have difficulty managing or taking your medications?: No  Follow up appointments  reviewed: PCP Follow-up appointment confirmed?: Yes Date of PCP follow-up appointment?: 02/29/24 Follow-up Provider: Herbie Drape, MD [facility] Follow-up appointment confirmed?: NA Do you need transportation to your follow-up appointment?: No Do you understand care options if your condition(s) worsen?: Yes-patient verbalized understanding    Derenda Fennel, RMA

## 2024-02-29 ENCOUNTER — Ambulatory Visit: Payer: BC Managed Care – PPO | Admitting: Adult Health

## 2024-02-29 ENCOUNTER — Ambulatory Visit: Payer: BC Managed Care – PPO | Admitting: Family Medicine

## 2024-02-29 ENCOUNTER — Encounter: Payer: Self-pay | Admitting: Family Medicine

## 2024-02-29 VITALS — BP 124/72 | HR 77 | Temp 98.1°F | Ht 75.0 in | Wt 268.4 lb

## 2024-02-29 DIAGNOSIS — J40 Bronchitis, not specified as acute or chronic: Secondary | ICD-10-CM

## 2024-02-29 NOTE — Assessment & Plan Note (Signed)
 I question whether Corey Pittman truly had pneumonia in light of his normal CXR and CBC on 3/3. His diarrhea certainly could be secondary to the Augmentin. I don't feel he will gain any further improvement with antibiotics, so advised him to stop this. I feel he can return to work tomorrow.

## 2024-02-29 NOTE — Progress Notes (Signed)
 [facility] [address] [place] [address] Dept: [phone] Dept Fax: [phone]  Office Visit  Subjective:    Patient ID: Corey Pittman, male    DOB: 19-Aug-1986, 38 y.o..   MRN: [id number]  Chief Complaint  Patient presents with   Follow-up    F/u pneumonia.  C/o having some stomach issues with diarrhea.    History of Present Illness:  Patient is in today for follow-up regarding pneumonia. [name] notes he had developed fatigue, body aches, fever, and cough last week. He had COVID in January, and had some persistent cough since that time, though he feels last week's episode was worse. His wife (a Engineer, civil (consulting)) listened to his lungs late last week and was concerned that he had crackles in his lungs. He went to Urgent Care on 02/24/2024. He had blood testing and an x-ray. He was told he had bilateral bronchopneumonia and was started on Augmentin and a Z-pack. He then developed severe diarrhea and had cramping in his leg muscles. He was seen at [facility] on 3/3. He had further lab tests and x-rays at that point. He continues to have some loose stools. He feels his cough has improved quite a bit. He still ahs 2 days worth of Augmentin left.  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   Seasonal and perennial allergic rhinitis 11/16/2021   Allergic conjunctivitis of both eyes 11/16/2021   Esophageal dysphagia 12/22/2017   Gastroesophageal reflux disease with esophagitis 12/22/2017   Mixed hyperlipidemia 12/22/2017   Class 1 obesity due to excess calories without serious comorbidity in adult 12/09/2016   Past Surgical History:  Procedure Laterality Date   ADENOIDECTOMY     Family History  Problem Relation Age of Onset   Allergic  rhinitis Mother    Cancer Mother    Obesity Mother    Allergic rhinitis Father    Cancer Father        Prostate   Obesity Father    Diabetes Father    Asthma Sister    Allergic rhinitis Sister    Cancer Maternal Uncle        Lung   Diabetes Maternal Grandmother    Heart disease Maternal Grandfather    Diabetes Maternal Grandfather    Diabetes Paternal Grandmother    Cancer Paternal Grandfather        Prostate   Eczema Neg Hx    Immunodeficiency Neg Hx    Urticaria Neg Hx    Atopy Neg Hx    Outpatient Medications Prior to Visit  Medication Sig Dispense Refill   albuterol (VENTOLIN HFA) 108 (90 Base) MCG/ACT inhaler Inhale 2 puffs into the lungs every 6 (six) hours as needed for wheezing or shortness of breath. 8 g 2   amoxicillin-clavulanate (AUGMENTIN) 875-125 MG tablet Take 1 tablet by mouth 2 (two) times daily.     azelastine (ASTELIN) 0.1 % nasal spray Place 2 sprays into both nostrils 2 (two) times daily as needed for rhinitis. Use in each nostril as directed 30 mL 6   EPINEPHrine 0.3 mg/0.3 mL IJ SOAJ injection Inject into the muscle.     fexofenadine (ALLEGRA) 180 MG tablet Take 180 mg by mouth daily.     fluticasone (FLONASE) 50 MCG/ACT nasal spray SPRAY 1 SPRAY INTO  EACH NOSTRIL EVERY DAY     ibuprofen (ADVIL) 800 MG tablet Take 1 tablet (800 mg total) by mouth 3 (three) times daily. 21 tablet 0   ipratropium (ATROVENT) 0.03 % nasal spray Place 2 sprays into both nostrils 3 (three) times daily. 30 mL 5   methocarbamol (ROBAXIN-750) 750 MG tablet Take 1 tablet (750 mg total) by mouth every 8 (eight) hours as needed for muscle spasms. 20 tablet 0   Multiple Vitamin (MULTI-VITAMIN) tablet Take 1 tablet by mouth daily.     ondansetron (ZOFRAN-ODT) 8 MG disintegrating tablet Take 1 tablet (8 mg total) by mouth every 8 (eight) hours as needed for nausea or vomiting. 20 tablet 0   pantoprazole (PROTONIX) 40 MG tablet Take 1 tablet (40 mg total) by mouth daily. 90 tablet 3    Probiotic Product (PROBIOTIC-10 PO) Take by mouth daily at 12 noon.     Spacer/Aero-Holding Chambers (OPTICHAMBER DIAMOND-LG MASK) DEVI USE AS DIRECTED AS NEEDED 1 each 1   predniSONE (DELTASONE) 10 MG tablet Take 3 tabs (30mg ) twice daily for 3 days, then 2 tabs (20mg ) twice daily for 3 days, then 1 tab (10mg ) twice daily for 3 days, then STOP. 36 tablet 0   No facility-administered medications prior to visit.   No Known Allergies   Objective:   Today's Vitals   02/29/24 1506  BP: 124/72  Pulse: 77  Temp: 98.1 F (36.7 C)  TempSrc: Temporal  SpO2: 96%  Weight: 268 lb 6.4 oz (121.7 kg)  Height: 6\' 3"  (1.905 m)   Body mass index is 33.55 kg/m.   General: Well developed, well nourished. No acute distress. HEENT: Normocephalic, non-traumatic. PERRL, EOMI. Conjunctiva clear. External ears normal. EAC   and TMs normal bilaterally. Nose clear without congestion or rhinorrhea. Mucous membranes moist.   Oropharynx clear. Good dentition. Lungs: Clear to auscultation bilaterally. No wheezing, rales or rhonchi. No cough. Psych: Alert and oriented. Normal mood and affect.  Health Maintenance Due  Topic Date Due   HIV Screening  Never done   Lab Results    Latest Ref Rng & Units 02/27/2024    5:56 PM 07/27/2022    9:30 AM 06/19/2014    7:50 PM  CBC  WBC 4.0 - 10.5 K/uL 7.5  6.4  4.5   Hemoglobin 13.0 - 17.0 g/dL 40.9  81.1  91.4   Hematocrit 39.0 - 52.0 % 46.8  46.6  43.4   Platelets 150 - 400 K/uL 252  255  137        Latest Ref Rng & Units 02/27/2024    5:56 PM 01/09/2024    8:54 AM 07/27/2022    9:30 AM  CMP  Glucose 70 - 99 mg/dL 76  97  96   BUN 6 - 20 mg/dL 10  14  13    Creatinine 0.61 - 1.24 mg/dL 7.82  9.56  2.13   Sodium 135 - 145 mmol/L 140  138  142   Potassium 3.5 - 5.1 mmol/L 3.8  4.2  4.6   Chloride 98 - 111 mmol/L 106  103  103   CO2 22 - 32 mmol/L 24  28  24    Calcium 8.9 - 10.3 mg/dL 8.9  9.3  9.7   Total Protein 6.5 - 8.1 g/dL 7.5   7.8   Total Bilirubin 0.0 -  1.2 mg/dL 0.6   0.9   Alkaline Phos 38 - 126 U/L 57   67   AST 15 - 41 U/L  19   26   ALT 0 - 44 U/L 22   50    Imaging: CXR, portable (02/27/2024) IMPRESSION: No active disease.     Assessment & Plan:   Problem List Items Addressed This Visit       Respiratory   Bronchitis - Primary   I question whether Mr. Rebello truly had pneumonia in light of his normal CXR and CBC on 3/3. His diarrhea certainly could be secondary to the Augmentin. I don't feel he will gain any further improvement with antibiotics, so advised him to stop this. I feel he can return to work tomorrow.       Return if symptoms worsen or fail to improve.   Loyola Mast, MD

## 2024-04-16 ENCOUNTER — Other Ambulatory Visit: Payer: Self-pay | Admitting: Allergy & Immunology

## 2024-04-20 NOTE — Patient Instructions (Addendum)
 Allergic rhinitis- Continue allergen avoidance measures directed toward pollens, molds, dust mite, and cat as listed below Allegra 180 mg daily or twice daily as needed. Continue ipratropium bromide  nasal spray using 2 sprays each nostril up to 3 times a day as needed for runny nose/drainage down throat.  Caution as this can be drying Continue Flonase (fluticasone) 2 sprays in each nostril once a day as needed for stuffy nose. Continue sinus rinses daily or as needed Consider saline nasal rinses as needed for nasal symptoms. Use this before any medicated nasal sprays for best result  Shortness of breath:  - use albuterol  2 puffs every 4 to 6 hours as needed for shortness of breath, wheezing - if needing more than twice a week, please schedule a follow-up  Allergic conjunctivitis Stop current eyedrop Start Pataday  (olopatadine ) placing 1 drop in each eye once a day as needed for itchy watery eyes.  Place Pataday  15 to 20 minutes before placing contacts. Sample given. If your insurance does not cover this medication you can buy it over-the-counter  Food intolerance Avoid any foods that cause you symptoms.  Oral allergy syndrome (apples, grapes, etc) Continue to avoid foods that irritate your mouth This may improve over time with your allergy injections.  Dyshidrotic eczema-controlled  Call the clinic if this treatment plan is not working well for you.  Follow up in 6 months or sooner if needed.

## 2024-04-23 ENCOUNTER — Encounter: Payer: Self-pay | Admitting: Family

## 2024-04-23 ENCOUNTER — Ambulatory Visit (INDEPENDENT_AMBULATORY_CARE_PROVIDER_SITE_OTHER): Admitting: Family

## 2024-04-23 VITALS — BP 128/66 | HR 72 | Temp 98.1°F | Resp 18 | Wt 273.7 lb

## 2024-04-23 DIAGNOSIS — L301 Dyshidrosis [pompholyx]: Secondary | ICD-10-CM

## 2024-04-23 DIAGNOSIS — H1013 Acute atopic conjunctivitis, bilateral: Secondary | ICD-10-CM

## 2024-04-23 DIAGNOSIS — R0602 Shortness of breath: Secondary | ICD-10-CM

## 2024-04-23 DIAGNOSIS — T781XXD Other adverse food reactions, not elsewhere classified, subsequent encounter: Secondary | ICD-10-CM

## 2024-04-23 DIAGNOSIS — J3089 Other allergic rhinitis: Secondary | ICD-10-CM | POA: Diagnosis not present

## 2024-04-23 DIAGNOSIS — J302 Other seasonal allergic rhinitis: Secondary | ICD-10-CM

## 2024-04-23 MED ORDER — ALBUTEROL SULFATE HFA 108 (90 BASE) MCG/ACT IN AERS: 2.0000 | INHALATION_SPRAY | Freq: Four times a day (QID) | RESPIRATORY_TRACT | 0 refills | Status: AC | PRN

## 2024-04-23 MED ORDER — FLUTICASONE PROPIONATE 50 MCG/ACT NA SUSP: NASAL | 5 refills | Status: DC

## 2024-04-23 MED ORDER — FEXOFENADINE HCL 180 MG PO TABS: ORAL_TABLET | ORAL | 5 refills | Status: DC

## 2024-04-23 MED ORDER — OLOPATADINE HCL 0.2 % OP SOLN: OPHTHALMIC | 5 refills | Status: AC

## 2024-04-23 MED ORDER — IPRATROPIUM BROMIDE 0.03 % NA SOLN: NASAL | 5 refills | Status: DC

## 2024-04-23 NOTE — Progress Notes (Signed)
 [address] [place] [address] Dept: [phone]  FOLLOW UP NOTE  Patient ID: Corey Pittman, male    DOB: 12-01-1986  Age: 38 y.o. MRN: [id number] Date of Office Visit: 04/23/2024  Assessment  Chief Complaint: Follow-up and Medication Reaction (Follow up, itchi eyes and atroven refill)  HPI Corey Pittman is a 38 year old male who presents today for follow-up of allergic rhinitis, shortness of breath, allergic conjunctivitis, food intolerance, oral allergy syndrome, and dyshidrotic eczema.  He was last seen on August 20, 2022 by Dr. Cornel Diesel.  He denies any new diagnosis or surgeries since his last office visit.  Allergic rhinitis: He is requesting refill on ipratropium nasal spray.  He reports that he had a bunch of this nasal spray and just recently ran out.  He is using ipratropium nasal spray in the morning and Flonase nasal spray at night.  He also takes Allegra daily.  He feels like his allergies are worse this year.  He keeps postnasal drip and only has nasal congestion if he is outside for extended period of time.  He denies rhinorrhea, fever, and chills.  He thinks he has maybe had 2 sinus infections in the past year.  He was previously on montelukast , but stopped because he could not tell a difference in use.  He also used to previously do allergy injections.  His last allergy injection was on September 17, 2022.  He reports that he started having reactions with the injections.  He would have bad whelps on his arms.  Then he changed jobs and the allergy injections became super expensive.  Shortness of breath.  He reports he will only have shortness of breath when he gets sick and that is the only time he will use his albuterol .  Otherwise he denies cough, wheeze, tightness in chest, shortness of breath, and nocturnal awakenings due to breathing problems.  He feels like he has been getting sick more frequently.  In early January he had COVID-19 and was given prednisone .  The cough  lingered and then turned into pneumonia.  He had bronchitis in March.  Intolerance/oral allergy syndrome.  He reports that he is only avoiding apples and grapes.  Dyshidrotic eczema is reported as being better.  He reports as long as he keeps lotion on them they are fine.  When his hands are dry  is when he has trouble.   Drug Allergies:  No Known Allergies  Review of Systems: Negative except as per HPI   Physical Exam: BP 128/66   Pulse 72   Temp 98.1 F (36.7 C) (Temporal)   Resp 18   Wt 273 lb 11.2 oz (124.1 kg)   SpO2 97%   BMI 34.21 kg/m    Physical Exam Constitutional:      Appearance: Normal appearance.  HENT:     Head: Normocephalic and atraumatic.     Comments: Pharynx normal, eyes normal, ears normal, nose normal    Right Ear: Tympanic membrane, ear canal and external ear normal.     Left Ear: Tympanic membrane, ear canal and external ear normal.     Nose: Nose normal.     Mouth/Throat:     Mouth: Mucous membranes are moist.     Pharynx: Oropharynx is clear.  Eyes:     Conjunctiva/sclera: Conjunctivae normal.  Cardiovascular:     Rate and Rhythm: Regular rhythm.     Heart sounds: Normal heart sounds.  Pulmonary:     Effort: Pulmonary effort is normal.  Breath sounds: Normal breath sounds.     Comments: Lungs clear to auscultation Musculoskeletal:     Cervical back: Neck supple.  Skin:    General: Skin is warm.  Neurological:     Mental Status: He is alert and oriented to person, place, and time.  Psychiatric:        Mood and Affect: Mood normal.        Behavior: Behavior normal.        Thought Content: Thought content normal.        Judgment: Judgment normal.     Diagnostics: FVC 5.08 L (81%), FEV1 4.19 L (83%), FEV1/FVC 0.82.  Predicted FVC 6.31 L, predicted FEV1 5.06 L.  Spirometry indicates normal respiratory function.  Assessment and Plan: 1. Seasonal and perennial allergic rhinitis   2. Shortness of breath   3. Allergic  conjunctivitis of both eyes   4. Dyshidrotic hand dermatitis   5. Adverse food reaction, subsequent encounter   6. Pollen-food allergy, subsequent encounter     Meds ordered this encounter  Medications   ipratropium (ATROVENT ) 0.03 % nasal spray    Sig: Place 2 drops in each nostril up to 3 times a day as needed for runny nose/drainage down the throat.  Caution as this may be drying    Dispense:  30 mL    Refill:  5   Olopatadine  HCl 0.2 % SOLN    Sig: Place 1 drop in each eye once a day as needed for itchy watery eyes.  Place Pataday  then wait 15 to 20 minutes before placing your contacts    Dispense:  2.5 mL    Refill:  5   fexofenadine (ALLEGRA) 180 MG tablet    Sig: Take 1 tablet by mouth once a day as needed for runny nose    Dispense:  30 tablet    Refill:  5   fluticasone (FLONASE) 50 MCG/ACT nasal spray    Sig: Place 2 sprays in each nostril once a day as needed for stuffy nose    Dispense:  16 g    Refill:  5   albuterol  (VENTOLIN  HFA) 108 (90 Base) MCG/ACT inhaler    Sig: Inhale 2 puffs into the lungs every 6 (six) hours as needed for wheezing or shortness of breath.    Dispense:  8 g    Refill:  0    Patient Instructions  Allergic rhinitis- Continue allergen avoidance measures directed toward pollens, molds, dust mite, and cat as listed below Allegra 180 mg daily or twice daily as needed. Continue ipratropium bromide  nasal spray using 2 sprays each nostril up to 3 times a day as needed for runny nose/drainage down throat.  Caution as this can be drying Continue Flonase (fluticasone) 2 sprays in each nostril once a day as needed for stuffy nose. Continue sinus rinses daily or as needed Consider saline nasal rinses as needed for nasal symptoms. Use this before any medicated nasal sprays for best result  Shortness of breath:  - use albuterol  2 puffs every 4 to 6 hours as needed for shortness of breath, wheezing - if needing more than twice a week, please schedule a  follow-up  Allergic conjunctivitis Stop current eyedrop Start Pataday  (olopatadine ) placing 1 drop in each eye once a day as needed for itchy watery eyes.  Place Pataday  15 to 20 minutes before placing contacts. Sample given. If your insurance does not cover this medication you can buy it over-the-counter  Food intolerance Avoid any  foods that cause you symptoms.  Oral allergy syndrome (apples, grapes, etc) Continue to avoid foods that irritate your mouth This may improve over time with your allergy injections.  Dyshidrotic eczema-controlled  Call the clinic if this treatment plan is not working well for you.  Follow up in 6 months or sooner if needed.    Return in about 6 months (around 10/23/2024), or if symptoms worsen or fail to improve.    Thank you for the opportunity to care for this patient.  Please do not hesitate to contact me with questions.  Tinnie Forehand, FNP [facility] 

## 2024-07-12 NOTE — Progress Notes (Signed)
 [facility] [address] [place]. [place] [address] [phone]       OFFICE FOLLOW UP NOTE  Mr. Luisalberto Beegle Date of Birth:  1986/11/24 Medical Record Number:  [id number]    Primary neurologist: Dr. Buck Reason for visit: CPAP follow-up  Virtual Visit via Video Note  I connected with Prentice Charnley on 07/16/24 at  7:45 AM EDT by a video enabled telemedicine application and verified that I am speaking with the correct person using two identifiers.  Location: Patient: at home Provider: in office, [facility]   I discussed the limitations of evaluation and management by telemedicine and the availability of in person appointments. The patient expressed understanding and agreed to proceed.   SUBJECTIVE:  Follow-up visit:  Prior visit: 03/01/2023 with Dr. Buck  Brief HPI:   Rutledge Selsor is a 38 y.o. male who is followed for OSA on CPAP.  Completed sleep study 11/2022 which showed overall mild sleep apnea with total AHI of 7/h and O2 nadir of 89%.  AutoPap therapy initiated 11/2022.  At prior visit with Dr. Buck, noted good compliance with optimal residual AHI.  Reported improvement of sleep-related complaints on CPAP.  ESS 9/24, previously 21/24.    Interval history:  Patient returns for CPAP compliance visit with compliance report noted below showing excellent usage and optimal residual AHI.  Main concern today is in regards to excessive daytime sleepiness. ESS 15/24.  Denies any difficulty tolerating CPAP.  Reports episodes of sleepy spells which can occur randomly throughout the day. Does not occur daily but several times per week. When these occur, it is hard for him to continue to function, needs to sit down and will be very difficult to stay awake and usually will end up falling asleep. This is not new but feels worsened over the past 6 months.  Denies any changes to medications or medical history. He does admit to only sleeping 4-6 hrs per night due to schedule  but even when he is on vacation and gets >8 hrs of sleep, these still occur. Denies vivid dreams and rarely has dreams. DME [facility], up to date on supplies.         ROS:   14 system review of systems performed and negative with exception of those listed in HPI  PMH:  Past Medical History:  Diagnosis Date   Acid reflux    Back pain    Chronic headaches    Edema of both lower extremities    Fatigue    GERD (gastroesophageal reflux disease)    High cholesterol    IBS (irritable bowel syndrome)    Joint pain    Pre-diabetes    SOB (shortness of breath)    Swallowing difficulty    Vitamin D  deficiency    Vitamin D  deficiency     PSH:  Past Surgical History:  Procedure Laterality Date   ADENOIDECTOMY      Social History:  Social History   Socioeconomic History   Marital status: Married    Spouse name: Magazine features editor   Number of children: Not on file   Years of education: Not on file   Highest education level: Some college, no degree  Occupational History   Occupation: Merchandising    Comment: [employer]  Tobacco Use   Smoking status: Never   Smokeless tobacco: Never  Vaping Use   Vaping status: Never Used  Substance and Sexual Activity   Alcohol use: No   Drug use: No  Sexual activity: Yes  Other Topics Concern   Not on file  Social History Narrative   Caffiene:  1-2 energy drinks daily.    Education: AD   Social Drivers of Corporate investment banker Strain: Not on file  Food Insecurity: Not on file  Transportation Needs: Not on file  Physical Activity: Not on file  Stress: Not on file  Social Connections: Unknown (07/20/2023)   Received from [facility]   Social Network    Social Network: Not on file  Intimate Partner Violence: Unknown (07/20/2023)   Received from [facility]   HITS    Physically Hurt: Not on file    Insult or Talk Down To: Not on file    Threaten Physical Harm: Not on file    Scream or Curse: Not on file     Family History:  Family History  Problem Relation Age of Onset   Allergic rhinitis Mother    Cancer Mother    Obesity Mother    Allergic rhinitis Father    Cancer Father        Prostate   Obesity Father    Diabetes Father    Asthma Sister    Allergic rhinitis Sister    Cancer Maternal Uncle        Lung   Diabetes Maternal Grandmother    Heart disease Maternal Grandfather    Diabetes Maternal Grandfather    Diabetes Paternal Grandmother    Cancer Paternal Grandfather        Prostate   Eczema Neg Hx    Immunodeficiency Neg Hx    Urticaria Neg Hx    Atopy Neg Hx     Medications:   Current Outpatient Medications on File Prior to Visit  Medication Sig Dispense Refill   albuterol  (VENTOLIN  HFA) 108 (90 Base) MCG/ACT inhaler Inhale 2 puffs into the lungs every 6 (six) hours as needed for wheezing or shortness of breath. 8 g 0   EPINEPHrine  0.3 mg/0.3 mL IJ SOAJ injection Inject into the muscle.     fexofenadine  (ALLEGRA ) 180 MG tablet Take 1 tablet by mouth once a day as needed for runny nose 30 tablet 5   fluticasone  (FLONASE ) 50 MCG/ACT nasal spray Place 2 sprays in each nostril once a day as needed for stuffy nose 16 g 5   ibuprofen  (ADVIL ) 800 MG tablet Take 1 tablet (800 mg total) by mouth 3 (three) times daily. 21 tablet 0   ipratropium (ATROVENT ) 0.03 % nasal spray Place 2 drops in each nostril up to 3 times a day as needed for runny nose/drainage down the throat.  Caution as this may be drying 30 mL 5   methocarbamol  (ROBAXIN -750) 750 MG tablet Take 1 tablet (750 mg total) by mouth every 8 (eight) hours as needed for muscle spasms. 20 tablet 0   Multiple Vitamin (MULTI-VITAMIN) tablet Take 1 tablet by mouth daily.     Olopatadine  HCl 0.2 % SOLN Place 1 drop in each eye once a day as needed for itchy watery eyes.  Place Pataday  then wait 15 to 20 minutes before placing your contacts 2.5 mL 5   ondansetron  (ZOFRAN -ODT) 8 MG disintegrating tablet Take 1 tablet (8 mg total)  by mouth every 8 (eight) hours as needed for nausea or vomiting. 20 tablet 0   pantoprazole  (PROTONIX ) 40 MG tablet Take 1 tablet (40 mg total) by mouth daily. 90 tablet 3   Probiotic Product (PROBIOTIC-10 PO) Take by mouth daily at 12 noon.  Spacer/Aero-Holding Chambers (OPTICHAMBER DIAMOND-LG MASK) DEVI USE AS DIRECTED AS NEEDED 1 each 1   No current facility-administered medications on file prior to visit.    Allergies:  No Known Allergies    OBJECTIVE:  Physical Exam  General: well developed, well nourished, seated, in no evident distress  Neurologic Exam Mental Status: Awake and fully alert. Oriented to place and time. Recent and remote memory intact. Attention span, concentration and fund of knowledge appropriate. Mood and affect appropriate.         ASSESSMENT/PLAN: Finley Chevez is a 38 y.o. year old male    OSA on CPAP :  Compliance report shows satisfactory usage with optimal residual AHI.   Continue current pressure settings 6-12 with EPR 2 Discussed continued nightly usage with ensuring greater than 4 hours nightly for optimal benefit and per insurance purposes.   Continue to follow with DME company for any needed supplies or CPAP related concerns DME [facility] CPAP set up 11/2022, due for new machine 11/2027  Excessive daytime sleepiness: ES 15/24 Did discuss increasing duration of sleep as this could be contributing  Feels he has sleep attacks even on nights he gets >8 hrs of sleep, will discuss pursing MSLT with Dr. Buck to rule out other underlying sleep disorder such as narcolepsy He is not currently on any antidepressants or stimulants    Follow-up timeframe will be determined after further treatment plan/evaluation     CC:  PCP: Thedora Garnette HERO, MD    I personally spent a total of 25 minutes in the care of the patient today including preparing to see the patient, getting/reviewing separately obtained history, counseling and  educating, placing orders, referring and communicating with other health care professionals, and documenting clinical information in the EHR.  Harlene Bogaert, AGNP-BC  [facility] [address] [place], [place] [address]  Phone [phone] Fax [phone] Note: This document was prepared with digital dictation and possible smart phrase technology. Any transcriptional errors that result from this process are unintentional.

## 2024-07-16 ENCOUNTER — Telehealth: Admitting: Adult Health

## 2024-07-16 ENCOUNTER — Encounter: Payer: Self-pay | Admitting: Adult Health

## 2024-07-16 DIAGNOSIS — G4719 Other hypersomnia: Secondary | ICD-10-CM

## 2024-07-16 DIAGNOSIS — G4733 Obstructive sleep apnea (adult) (pediatric): Secondary | ICD-10-CM

## 2024-07-19 NOTE — Telephone Encounter (Signed)
 Melroy Bougher D, CMA  [place], [place]; Rumalda Raring; [place], [place] New orders have been placed for the above pt, DOB: 1986/11/09 Thanks

## 2024-10-18 ENCOUNTER — Other Ambulatory Visit: Payer: Self-pay | Admitting: Family

## 2024-10-24 NOTE — Patient Instructions (Incomplete)
 Allergic rhinitis- Continue allergen avoidance measures directed toward pollens, molds, dust mite, and cat as listed below Allegra  180 mg daily or twice daily as needed. Continue ipratropium bromide  nasal spray using 2 sprays each nostril up to 3 times a day as needed for runny nose/drainage down throat.  Caution as this can be drying Continue Flonase  (fluticasone ) 2 sprays in each nostril once a day as needed for stuffy nose. Continue sinus rinses daily or as needed Consider saline nasal rinses as needed for nasal symptoms. Use this before any medicated nasal sprays for best result Consider restarting allergy injections.  Shortness of breath:  -Your breathing test looks great today - use albuterol  2 puffs every 4 to 6 hours as needed for shortness of breath, wheezing - if needing more than twice a week, please schedule a follow-up  Allergic conjunctivitis Continue Pataday  (olopatadine ) placing 1 drop in each eye once a day as needed for itchy watery eyes.  Place Pataday  15 to 20 minutes before placing contacts If your insurance does not cover this medication you can buy it over-the-counter  Food intolerance Avoid any foods that cause you symptoms.  Oral allergy syndrome (apples, grapes, etc) Continue to avoid foods that irritate your mouth This may improve over time with your allergy injections.  Dyshidrotic eczema-controlled with over the counter lotion  Call the clinic if this treatment plan is not working well for you.  Follow up in 6 months or sooner if needed.

## 2024-10-25 ENCOUNTER — Encounter: Payer: Self-pay | Admitting: Family

## 2024-10-25 ENCOUNTER — Ambulatory Visit (INDEPENDENT_AMBULATORY_CARE_PROVIDER_SITE_OTHER): Admitting: Family

## 2024-10-25 VITALS — BP 126/80 | HR 69 | Temp 97.8°F | Resp 20 | Ht 75.0 in | Wt 293.0 lb

## 2024-10-25 DIAGNOSIS — J3089 Other allergic rhinitis: Secondary | ICD-10-CM | POA: Diagnosis not present

## 2024-10-25 DIAGNOSIS — T7819XD Other adverse food reactions, not elsewhere classified, subsequent encounter: Secondary | ICD-10-CM | POA: Diagnosis not present

## 2024-10-25 DIAGNOSIS — H1013 Acute atopic conjunctivitis, bilateral: Secondary | ICD-10-CM

## 2024-10-25 DIAGNOSIS — L301 Dyshidrosis [pompholyx]: Secondary | ICD-10-CM

## 2024-10-25 DIAGNOSIS — R0602 Shortness of breath: Secondary | ICD-10-CM | POA: Diagnosis not present

## 2024-10-25 DIAGNOSIS — J302 Other seasonal allergic rhinitis: Secondary | ICD-10-CM

## 2024-10-25 NOTE — Progress Notes (Signed)
 [address] [place] [address] Dept: [phone]  FOLLOW UP NOTE  Patient ID: Corey Pittman, male    DOB: 10-03-1986  Age: 38 y.o. MRN: [id number] Date of Office Visit: 10/25/2024  Assessment  Chief Complaint: No chief complaint on file.  HPI Corey Pittman is a 38 year old male who presents today for follow-up of seasonal and perennial allergic rhinitis, shortness of breath, allergic conjunctivitis of both eyes, dyshidrotic hand dermatitis, adverse food reaction, pollen food allergy.  He was last seen on April 23, 2024 by myself.  He denies any new diagnosis or surgery since his last office visit.  Allergic rhinitis: He reports that he will periodically have nasal congestion and has postnasal drip.  He denies rhinorrhea.  He reports that his allergy symptoms are not too bad right now, but his symptoms are typically bad in the fall.  He has not been treated for any sinus infections since we last saw him.  He was previously on allergy injections.  His last injection was September 17, 2022.  He was developing large whelps and changed jobs and the injections became expensive.  He started allergy injections on January 01, 2022.  He continues to take Allegra  180 mg once a day, ipratropium bromide  nasal spray daily, and Flonase  nasal spray daily.  Shortness of breath: He only has shortness of breath when he is sick.  He denies cough, wheeze, tightness in chest, and shortness of breath.  Since his last office visit not made any trips to the emergency room or urgent care due to breathing problems and has not received any systemic steroids.  He does not think he has used his albuterol  inhaler since we last saw him.  Allergic conjunctivitis: He reports itchy eyes at times he will use Pataday  and it helps.  He does wear contacts.  Oral allergy syndrome: He reports that he is not having to avoid apples and grapes much any more.  They do not seem to bother him as bad.  He reports certain types of apples  cause more symptoms than others. He is not sure if the allergy injections helped with this.   Drug Allergies:  No Known Allergies  Review of Systems: Negative except as per HPI   Physical Exam: BP 126/80 (BP Location: Left Arm, Patient Position: Sitting, Cuff Size: Large)   Pulse 69   Temp 97.8 F (36.6 C) (Oral)   Resp 20   Ht 6' 3 (1.905 m)   Wt 293 lb (132.9 kg)   SpO2 97%   BMI 36.62 kg/m    Physical Exam Constitutional:      Appearance: Normal appearance.  HENT:     Head: Normocephalic and atraumatic.     Comments: Pharynx normal, eyes normal, ears normal, nose normal    Right Ear: Tympanic membrane, ear canal and external ear normal.     Left Ear: Tympanic membrane, ear canal and external ear normal.     Nose: Nose normal.     Mouth/Throat:     Mouth: Mucous membranes are moist.     Pharynx: Oropharynx is clear.  Eyes:     Conjunctiva/sclera: Conjunctivae normal.  Cardiovascular:     Rate and Rhythm: Regular rhythm.     Heart sounds: Normal heart sounds.  Pulmonary:     Effort: Pulmonary effort is normal.     Breath sounds: Normal breath sounds.     Comments: Lungs clear to auscultation Musculoskeletal:     Cervical back: Neck supple.  Skin:    General: Skin is warm.     Comments: No eczematous lesions noted  Neurological:     Mental Status: He is alert and oriented to person, place, and time.  Psychiatric:        Mood and Affect: Mood normal.        Behavior: Behavior normal.        Thought Content: Thought content normal.        Judgment: Judgment normal.     Diagnostics: FVC 5.44 L (86%), FEV1 4.73 L (93%), FEV1/FVC 0.87.  Spirometry indicates normal spirometry.  Assessment and Plan: 1. Seasonal and perennial allergic rhinitis   2. Shortness of breath   3. Allergic conjunctivitis of both eyes   4. Pollen-food allergy, subsequent encounter   5. Dyshidrotic hand dermatitis     No orders of the defined types were placed in this  encounter.   Patient Instructions  Allergic rhinitis- Continue allergen avoidance measures directed toward pollens, molds, dust mite, and cat as listed below Allegra  180 mg daily or twice daily as needed. Continue ipratropium bromide  nasal spray using 2 sprays each nostril up to 3 times a day as needed for runny nose/drainage down throat.  Caution as this can be drying Continue Flonase  (fluticasone ) 2 sprays in each nostril once a day as needed for stuffy nose. Continue sinus rinses daily or as needed Consider saline nasal rinses as needed for nasal symptoms. Use this before any medicated nasal sprays for best result Consider restarting allergy injections.  Shortness of breath:  -Your breathing test looks great today - use albuterol  2 puffs every 4 to 6 hours as needed for shortness of breath, wheezing - if needing more than twice a week, please schedule a follow-up  Allergic conjunctivitis Continue Pataday  (olopatadine ) placing 1 drop in each eye once a day as needed for itchy watery eyes.  Place Pataday  15 to 20 minutes before placing contacts If your insurance does not cover this medication you can buy it over-the-counter  Food intolerance Avoid any foods that cause you symptoms.  Oral allergy syndrome (apples, grapes, etc) Continue to avoid foods that irritate your mouth This may improve over time with your allergy injections.  Dyshidrotic eczema-controlled with over the counter lotion  Call the clinic if this treatment plan is not working well for you.  Follow up in 6 months or sooner if needed.   Return in about 6 months (around 04/25/2025), or if symptoms worsen or fail to improve.    Thank you for the opportunity to care for this patient.  Please do not hesitate to contact me with questions.  Wanda Craze, FNP [facility] 

## 2024-10-29 ENCOUNTER — Other Ambulatory Visit: Payer: Self-pay | Admitting: Family

## 2024-11-12 ENCOUNTER — Other Ambulatory Visit: Payer: Self-pay | Admitting: Family

## 2024-11-29 ENCOUNTER — Ambulatory Visit: Admitting: *Deleted

## 2024-11-29 DIAGNOSIS — Z23 Encounter for immunization: Secondary | ICD-10-CM | POA: Diagnosis not present

## 2025-01-06 ENCOUNTER — Other Ambulatory Visit: Payer: Self-pay | Admitting: Family Medicine

## 2025-01-06 DIAGNOSIS — K2100 Gastro-esophageal reflux disease with esophagitis, without bleeding: Secondary | ICD-10-CM

## 2025-01-14 ENCOUNTER — Encounter: Payer: BC Managed Care – PPO | Admitting: Family Medicine

## 2025-01-14 NOTE — Progress Notes (Incomplete)
 " [facility] [address] [place] [address] Dept: [phone] Dept Fax: [phone]  Annual Physical Visit  Subjective:    Patient ID: Corey Pittman, male    DOB: 05-19-1986, 39 y.o..   MRN: [id number]  No chief complaint on file.  History of Present Illness:  Patient is in today for an annual physical/preventative visit.  Mr. Marro has a history of GERD. He finds this is well controlled when on his medications (pantoprazole  40 mg daily).   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   Seasonal and perennial allergic rhinitis 11/16/2021   Allergic conjunctivitis of both eyes 11/16/2021   Esophageal dysphagia 12/22/2017   Gastroesophageal reflux disease with esophagitis 12/22/2017   Mixed hyperlipidemia 12/22/2017   Class 1 obesity due to excess calories without serious comorbidity in adult 12/09/2016   Past Surgical History:  Procedure Laterality Date   ADENOIDECTOMY     Family History  Problem Relation Age of Onset   Allergic rhinitis Mother    Cancer Mother    Obesity Mother    Allergic rhinitis Father    Cancer Father        Prostate   Obesity Father    Diabetes Father    Asthma Sister    Allergic rhinitis Sister    Cancer Maternal Uncle        Lung   Diabetes Maternal Grandmother    Heart disease Maternal Grandfather    Diabetes Maternal Grandfather    Diabetes Paternal Grandmother    Cancer Paternal Grandfather        Prostate   Eczema Neg Hx    Immunodeficiency Neg Hx    Urticaria Neg Hx    Atopy Neg Hx    Outpatient Medications Prior to Visit  Medication Sig Dispense Refill   albuterol  (VENTOLIN  HFA) 108 (90 Base) MCG/ACT inhaler Inhale 2 puffs into the lungs every 6 (six) hours as needed for wheezing  or shortness of breath. 8 g 0   EPINEPHrine  0.3 mg/0.3 mL IJ SOAJ injection Inject into the muscle.     fexofenadine  (ALLEGRA ) 180 MG tablet TAKE 1 TABLET BY MOUTH ONCE A DAY AS NEEDED FOR RUNNY NOSE 30 tablet 5   fluticasone  (FLONASE ) 50 MCG/ACT nasal spray PLACE 2 SPRAYS IN EACH NOSTRIL ONCE A DAY AS NEEDED FOR STUFFY NOSE 48 mL 1   ibuprofen  (ADVIL ) 800 MG tablet Take 1 tablet (800 mg total) by mouth 3 (three) times daily. 21 tablet 0   ipratropium (ATROVENT ) 0.03 % nasal spray PLACE 2 DROPS IN EACH NOSTRIL UP TO 3 TIMES A DAY AS NEEDED FOR RUNNY NOSE/DRAINAGE DOWN THE THROAT. CAUTION AS THIS MAY BE DRYING 90 mL 1   methocarbamol  (ROBAXIN -750) 750 MG tablet Take 1 tablet (750 mg total) by mouth every 8 (eight) hours as needed for muscle spasms. 20 tablet 0   Multiple Vitamin (MULTI-VITAMIN) tablet Take 1 tablet by mouth daily.     Olopatadine  HCl 0.2 % SOLN Place 1 drop in each eye once a day as needed for itchy watery eyes.  Place Pataday  then wait 15 to 20 minutes before placing your contacts 2.5 mL 5   ondansetron  (ZOFRAN -ODT) 8 MG disintegrating tablet Take 1 tablet (8 mg total) by mouth every 8 (eight) hours as needed for nausea or  vomiting. 20 tablet 0   pantoprazole  (PROTONIX ) 40 MG tablet TAKE 1 TABLET BY MOUTH EVERY DAY 90 tablet 3   Probiotic Product (PROBIOTIC-10 PO) Take by mouth daily at 12 noon.     Spacer/Aero-Holding Chambers (OPTICHAMBER DIAMOND-LG MASK) DEVI USE AS DIRECTED AS NEEDED 1 each 1   No facility-administered medications prior to visit.   Allergies[1] Objective:   There were no vitals filed for this visit. There is no height or weight on file to calculate BMI.   General: Well developed, well nourished. No acute distress. HEENT: Normocephalic, non-traumatic. PERRL, EOMI. Conjunctiva clear. External ears normal. EAC and TMs normal bilaterally. Nose    clear without congestion or rhinorrhea. Mucous membranes moist. Oropharynx clear. Good dentition. Neck: Supple.  No lymphadenopathy. No thyromegaly. Lungs: Clear to auscultation bilaterally. No wheezing, rales or rhonchi. CV: RRR without murmurs or rubs. Pulses 2+ bilaterally. Abdomen: Soft, non-tender. Bowel sounds positive, normal pitch and frequency. No hepatosplenomegaly. No rebound or guarding. Back: Straight. No CVA tenderness bilaterally. Extremities: Full ROM. No joint swelling or tenderness. No edema noted. Skin: Warm and dry. No rashes. Neuro: CN II-XII intact. Normal sensation and DTR bilaterally. Psych: Alert and oriented. Normal mood and affect.  Health Maintenance Due  Topic Date Due   HIV Screening  Never done   COVID-19 Vaccine (4 - 2025-26 season) 08/27/2024   Lab Results {Labs (Optional):29002}  Lipid Panel:  Lab Results  Component Value Date   CHOL 170 01/09/2024   HDL 31.90 (L) 01/09/2024   LDLCALC 101 (H) 01/09/2024   TRIG 185.0 (H) 01/09/2024     Lab Results  Component Value Date   GLUCOSE 76 02/27/2024   HGBA1C 5.8 01/09/2024   Assessment & Plan:   Problem List Items Addressed This Visit   None   No follow-ups on file.   [name], MD  I,Emily Lagle,acting as a scribe for [name], MD.,have documented all relevant documentation on the behalf of [name], MD.  I, [name], MD, have reviewed all documentation for this visit. The documentation on 01/14/2025 for the exam, diagnosis, procedures, and orders are all accurate and complete.     [1] No Known Allergies  "

## 2025-01-14 NOTE — Assessment & Plan Note (Signed)
 Blood sugar and A1c have been within normal limit. I will recheck a blood sugar and A1c today. Encourage regular exercise.

## 2025-01-14 NOTE — Assessment & Plan Note (Signed)
"  Stable.  Continue pantoprazole  40 mg daily.    "

## 2025-01-14 NOTE — Assessment & Plan Note (Signed)
 Last lipids elevated with LDL 101, triglycerides 185, but total cholesterol was within limits at 170. I will repeat lipid panel today.

## 2025-01-15 ENCOUNTER — Telehealth: Payer: Self-pay | Admitting: Internal Medicine

## 2025-01-15 NOTE — Telephone Encounter (Signed)
 Patient called stating his started tested positive for Covid last week and now he has tested positive. The patient is wanting a prescription of Paxlovid  to be sent into the [facility] in [place].

## 2025-01-16 ENCOUNTER — Other Ambulatory Visit: Payer: Self-pay | Admitting: Internal Medicine

## 2025-01-16 MED ORDER — PAXLOVID (300/100) 20 X 150 MG & 10 X 100MG PO TBPK: ORAL_TABLET | ORAL | 0 refills | Status: AC

## 2025-01-16 NOTE — Telephone Encounter (Signed)
 Can you please send in Paxlovid  for him? Also, I am off on Tuesday. Can we make sure these types of messages are sent to someone in clinic if the provider is off to avoid delays in patient care? Thanks!

## 2025-01-16 NOTE — Telephone Encounter (Signed)
 Paxlovid  sent to pharmacy and patient was advised and requested work note from 01-15-25 to 01-21-25

## 2025-01-16 NOTE — Telephone Encounter (Signed)
 Work note printed and up front for wife to pick up patient aware.

## 2025-02-19 ENCOUNTER — Encounter: Admitting: Family Medicine

## 2025-04-25 ENCOUNTER — Ambulatory Visit: Admitting: Family
